# Patient Record
Sex: Female | Born: 1975 | Race: White | Hispanic: Yes | Marital: Married | Smoking: Never smoker
Health system: Southern US, Community
[De-identification: ages and names within clinical notes are randomized; demographics above are authoritative.]

## PROBLEM LIST (undated history)

## (undated) DIAGNOSIS — E039 Hypothyroidism, unspecified: Secondary | ICD-10-CM

## (undated) DIAGNOSIS — A64 Unspecified sexually transmitted disease: Secondary | ICD-10-CM

## (undated) DIAGNOSIS — D071 Carcinoma in situ of vulva: Secondary | ICD-10-CM

## (undated) DIAGNOSIS — E559 Vitamin D deficiency, unspecified: Secondary | ICD-10-CM

## (undated) DIAGNOSIS — D259 Leiomyoma of uterus, unspecified: Secondary | ICD-10-CM

## (undated) HISTORY — DX: Unspecified sexually transmitted disease: A64

## (undated) HISTORY — DX: Carcinoma in situ of vulva: D07.1

## (undated) HISTORY — DX: Hypothyroidism, unspecified: E03.9

## (undated) HISTORY — DX: Vitamin D deficiency, unspecified: E55.9

## (undated) HISTORY — PX: LAPAROSCOPIC TUBAL LIGATION: SHX1937

---

## 1999-06-16 ENCOUNTER — Emergency Department (HOSPITAL_COMMUNITY): Admission: EM | Admit: 1999-06-16 | Discharge: 1999-06-16 | Payer: Self-pay | Admitting: Emergency Medicine

## 1999-06-16 ENCOUNTER — Encounter: Payer: Self-pay | Admitting: Emergency Medicine

## 2007-07-23 ENCOUNTER — Emergency Department (HOSPITAL_COMMUNITY): Admission: EM | Admit: 2007-07-23 | Discharge: 2007-07-23 | Payer: Self-pay | Admitting: Emergency Medicine

## 2008-05-07 ENCOUNTER — Ambulatory Visit (HOSPITAL_COMMUNITY): Admission: RE | Admit: 2008-05-07 | Discharge: 2008-05-07 | Payer: Self-pay | Admitting: Family Medicine

## 2009-04-07 DIAGNOSIS — D071 Carcinoma in situ of vulva: Secondary | ICD-10-CM

## 2009-04-07 HISTORY — DX: Carcinoma in situ of vulva: D07.1

## 2009-04-21 ENCOUNTER — Ambulatory Visit: Payer: Self-pay | Admitting: Gynecology

## 2009-04-28 ENCOUNTER — Ambulatory Visit: Payer: Self-pay | Admitting: Gynecology

## 2009-05-12 ENCOUNTER — Ambulatory Visit: Payer: Self-pay | Admitting: Gynecology

## 2009-06-30 ENCOUNTER — Ambulatory Visit: Payer: Self-pay | Admitting: Gynecology

## 2009-12-29 ENCOUNTER — Ambulatory Visit: Payer: Self-pay | Admitting: Gynecology

## 2009-12-29 ENCOUNTER — Other Ambulatory Visit: Admission: RE | Admit: 2009-12-29 | Discharge: 2009-12-29 | Payer: Self-pay | Admitting: Gynecology

## 2010-05-25 ENCOUNTER — Other Ambulatory Visit: Admission: RE | Admit: 2010-05-25 | Discharge: 2010-05-25 | Payer: Self-pay | Admitting: Gynecology

## 2010-05-25 ENCOUNTER — Ambulatory Visit: Payer: Self-pay | Admitting: Gynecology

## 2011-02-21 ENCOUNTER — Other Ambulatory Visit (HOSPITAL_COMMUNITY)
Admission: RE | Admit: 2011-02-21 | Discharge: 2011-02-21 | Disposition: A | Discharge status: Home / Self Care | Payer: BC Managed Care – PPO | Source: Ambulatory Visit | Attending: Gynecology | Admitting: Gynecology

## 2011-02-21 ENCOUNTER — Other Ambulatory Visit: Payer: Self-pay | Admitting: Gynecology

## 2011-02-21 ENCOUNTER — Encounter (INDEPENDENT_AMBULATORY_CARE_PROVIDER_SITE_OTHER): Payer: BC Managed Care – PPO | Admitting: Gynecology

## 2011-02-21 DIAGNOSIS — N390 Urinary tract infection, site not specified: Secondary | ICD-10-CM

## 2011-02-21 DIAGNOSIS — R5381 Other malaise: Secondary | ICD-10-CM

## 2011-02-21 DIAGNOSIS — N760 Acute vaginitis: Secondary | ICD-10-CM

## 2011-02-21 DIAGNOSIS — Z124 Encounter for screening for malignant neoplasm of cervix: Secondary | ICD-10-CM | POA: Insufficient documentation

## 2011-02-21 DIAGNOSIS — R5383 Other fatigue: Secondary | ICD-10-CM

## 2011-02-21 DIAGNOSIS — Z13220 Encounter for screening for lipoid disorders: Secondary | ICD-10-CM

## 2011-02-21 DIAGNOSIS — R823 Hemoglobinuria: Secondary | ICD-10-CM

## 2011-02-21 DIAGNOSIS — Z833 Family history of diabetes mellitus: Secondary | ICD-10-CM

## 2011-02-21 DIAGNOSIS — A64 Unspecified sexually transmitted disease: Secondary | ICD-10-CM

## 2011-02-21 DIAGNOSIS — Z113 Encounter for screening for infections with a predominantly sexual mode of transmission: Secondary | ICD-10-CM

## 2011-02-21 DIAGNOSIS — D259 Leiomyoma of uterus, unspecified: Secondary | ICD-10-CM

## 2011-02-21 DIAGNOSIS — N831 Corpus luteum cyst of ovary, unspecified side: Secondary | ICD-10-CM

## 2011-02-21 HISTORY — DX: Unspecified sexually transmitted disease: A64

## 2011-02-22 ENCOUNTER — Other Ambulatory Visit: Payer: Self-pay | Admitting: Gynecology

## 2011-02-22 DIAGNOSIS — N644 Mastodynia: Secondary | ICD-10-CM

## 2011-03-28 ENCOUNTER — Ambulatory Visit (INDEPENDENT_AMBULATORY_CARE_PROVIDER_SITE_OTHER): Payer: BC Managed Care – PPO | Admitting: Gynecology

## 2011-03-28 DIAGNOSIS — N898 Other specified noninflammatory disorders of vagina: Secondary | ICD-10-CM

## 2011-03-28 DIAGNOSIS — Z113 Encounter for screening for infections with a predominantly sexual mode of transmission: Secondary | ICD-10-CM

## 2011-03-28 DIAGNOSIS — B373 Candidiasis of vulva and vagina: Secondary | ICD-10-CM

## 2011-03-28 DIAGNOSIS — N83209 Unspecified ovarian cyst, unspecified side: Secondary | ICD-10-CM

## 2011-03-28 DIAGNOSIS — N946 Dysmenorrhea, unspecified: Secondary | ICD-10-CM

## 2011-03-30 ENCOUNTER — Other Ambulatory Visit (INDEPENDENT_AMBULATORY_CARE_PROVIDER_SITE_OTHER): Payer: BC Managed Care – PPO | Admitting: Gynecology

## 2011-03-30 DIAGNOSIS — Z113 Encounter for screening for infections with a predominantly sexual mode of transmission: Secondary | ICD-10-CM

## 2011-04-05 ENCOUNTER — Other Ambulatory Visit: Payer: BC Managed Care – PPO | Admitting: Gynecology

## 2011-04-18 ENCOUNTER — Encounter (HOSPITAL_COMMUNITY): Payer: Self-pay

## 2011-04-18 ENCOUNTER — Institutional Professional Consult (permissible substitution) (INDEPENDENT_AMBULATORY_CARE_PROVIDER_SITE_OTHER): Payer: BC Managed Care – PPO | Admitting: Gynecology

## 2011-04-18 ENCOUNTER — Encounter (HOSPITAL_COMMUNITY)
Admission: RE | Admit: 2011-04-18 | Discharge: 2011-04-18 | Disposition: A | Discharge status: Home / Self Care | Payer: BC Managed Care – PPO | Source: Ambulatory Visit | Attending: Gynecology | Admitting: Gynecology

## 2011-04-18 DIAGNOSIS — D259 Leiomyoma of uterus, unspecified: Secondary | ICD-10-CM

## 2011-04-18 DIAGNOSIS — N949 Unspecified condition associated with female genital organs and menstrual cycle: Secondary | ICD-10-CM

## 2011-04-18 LAB — SURGICAL PCR SCREEN
MRSA, PCR: NEGATIVE
Staphylococcus aureus: NEGATIVE

## 2011-04-18 LAB — CBC
HCT: 41.9 % (ref 36.0–46.0)
MCH: 30.8 pg (ref 26.0–34.0)
MCV: 92.9 fL (ref 78.0–100.0)
Platelets: 260 10*3/uL (ref 150–400)
RDW: 13.9 % (ref 11.5–15.5)

## 2011-04-18 NOTE — Patient Instructions (Addendum)
19 Abigail Reid  04/18/2011   Your procedure is scheduled on: 04/25/11   Report to [facility] at 700 AM.  Call this number if you have problems the morning of surgery: [phone]   Remember:   Do not eat food:After Midnight.  Do not drink clear liquids: After Midnight.  Take these medicines the morning of surgery with A SIP OF WATER: N/A   Do not wear jewelry, make-up or nail polish.  Do not bring valuables to the hospital.  Contacts, dentures or bridgework may not be worn into surgery.  Leave suitcase in the car. After surgery it may be brought to your room.  For patients admitted to the hospital, checkout time is 11:00 AM the day of discharge.   Patients discharged the day of surgery will not be allowed to drive home.  Name and phone number of your driver: N/A

## 2011-04-18 NOTE — Anesthesia Preprocedure Evaluation (Signed)
Anesthesia Evaluation  Name, MR# and DOB Patient awake  General Assessment Comment  Reviewed: Allergy & Precautions and H&P   History of Anesthesia Complications Negative for: history of anesthetic complications  Airway Mallampati: III TM Distance: >3 FB Neck ROM: Full    Dental  (+) Teeth Intact and Partial Upper   Pulmonaryneg pulmonary ROS    clear to auscultation  pulmonary exam normal   Cardiovascular Regular Normal   Neuro/Psych  GI/Hepatic/Renal negative GI ROS, negative Liver ROS, and negative Renal ROS (+)       Endo/Other  Negative Endocrine ROS (+)   Abdominal Normal abdominal exam  (+)   Musculoskeletal negative musculoskeletal ROS (+)  Hematology negative hematology ROS (+)   Peds  Reproductive/Obstetrics negative OB ROS   Anesthesia Other Findings             Anesthesia Physical Anesthesia Plan  ASA: II  Anesthesia Plan: General   Post-op Pain Management:    Induction: Intravenous  Airway Management Planned: Oral ETT  Additional Equipment:   Intra-op Plan:   Post-operative Plan:   Informed Consent: I have reviewed the patients History and Physical, chart, labs and discussed the procedure including the risks, benefits and alternatives for the proposed anesthesia with the patient or authorized representative who has indicated his/her understanding and acceptance.   Dental advisory given  Plan Discussed with: Anesthesiologist (AP)  Anesthesia Plan Comments:         Anesthesia Quick Evaluation

## 2011-04-23 ENCOUNTER — Encounter (HOSPITAL_COMMUNITY): Payer: Self-pay | Admitting: Gynecology

## 2011-04-23 DIAGNOSIS — R102 Pelvic and perineal pain: Secondary | ICD-10-CM | POA: Diagnosis present

## 2011-04-23 DIAGNOSIS — D259 Leiomyoma of uterus, unspecified: Secondary | ICD-10-CM | POA: Diagnosis present

## 2011-04-23 NOTE — H&P (Signed)
Abigail Reid is an 35 y.o. female with lower abdominal pain from fibroid uterus radiating down to lower back and right leg.  Pertinent Gynecological History: Menses: flow is moderate and with severe dysmenorrhea Bleeding: normal Contraception: tubal ligation DES exposure: denies Blood transfusions: none Sexually transmitted diseases: HPV, Chlamydia Previous GYN Procedures: BTL  Last mammogram: N/A Date:N/A Last pap: normal Date:02/2011  OB History: G3, P3  Menstrual History: Menarche age: 69 No LMP recorded.    Past Medical History  Diagnosis Date  . Asthma     no inhaler needed x 42yrs  . Vaginal delivery     GRAVIDA 3 PARA 3  . 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 STD (sexually transmitted disease) 02/21/11    POS CHLAMYDIA    Past Surgical History  Procedure Date  . Tubal ligation     History reviewed. No pertinent family history.  Social History:  reports that she has never smoked. She does not have any smokeless tobacco history on file. She reports that she drinks alcohol. She reports that she does not use illicit drugs.  Allergies: No Known Allergies  No prescriptions prior to admission    Review of Systems  Constitutional: Negative.   HENT: Negative.   Eyes: Negative.   Respiratory: Negative.   Cardiovascular: Negative.   Gastrointestinal: Negative.   Genitourinary:       Patient with 11.0x6.8x6.2 cm uterus with subserous myoma 7.4x7.2x6.2 cm  Musculoskeletal: Negative.   Skin: Negative.   Neurological: Negative.   Endo/Heme/Allergies: Negative.   Psychiatric/Behavioral: Negative.     There were no vitals taken for this visit. Physical Exam  Constitutional: She is oriented to person, place, and time. She appears well-developed and well-nourished.  HENT:  Head: Normocephalic.  Eyes: Pupils are equal, round, and reactive to light.  Neck: Normal range of motion. Neck supple.  Cardiovascular: Normal  rate, regular rhythm and normal heart sounds.   Respiratory: Effort normal and breath sounds normal.  GI: Soft. Bowel sounds are normal.  Genitourinary: Rectum normal and vagina normal. Pelvic exam was performed with patient supine. Uterus is enlarged. Right adnexum displays mass.       Patient with an 11.0x6.8x6.2 cm uterus with subserous myoma 7.4 x 7.2 x 6.2 cm  Musculoskeletal: Normal range of motion.  Neurological: She is alert and oriented to person, place, and time. She has normal reflexes.  Skin: Skin is warm and dry.  Psychiatric: She has a normal mood and affect.    No results found for this or any previous visit (from the past 24 hour(s)).  No results found.  Assessment/Plan: Patient with symptomatic leio myomatous uteri scheduled for total laparoscopic hysterectomy with possible right salpingoophorectomy. Ok Edwards 04/23/2011, 2:25 PM

## 2011-04-24 ENCOUNTER — Encounter (HOSPITAL_COMMUNITY): Payer: Self-pay | Admitting: Gynecology

## 2011-04-24 NOTE — H&P (Signed)
Patient was counseled as to the risk of surgery to include the following: 1. Infection (prohylactic antibiotics will be administered) 2. DVT/Pulmonary Embolism (prophylactic pneumo compression stockings will be used) 3.Trauma to internal organs requiring additional surgical procedure to repair any injury to     Internal organs requiring perhaps additional hospitalization days. 4.Hemmorhage requiring transfusion and blood products which carry risks such as             anaphylactic reaction, hepatitis and AIDS  Patient had received literature information on the procedure scheduled and all her questions were answered in her native tongue and accepts all risk.

## 2011-04-24 NOTE — Progress Notes (Signed)
No change in patients past medical history in past 24 hours to the best of my knowledge.

## 2011-04-25 ENCOUNTER — Encounter (HOSPITAL_COMMUNITY): Payer: Self-pay | Admitting: Anesthesiology

## 2011-04-25 ENCOUNTER — Ambulatory Visit (HOSPITAL_COMMUNITY): Payer: BC Managed Care – PPO | Admitting: Certified Registered Nurse Anesthetist

## 2011-04-25 ENCOUNTER — Inpatient Hospital Stay (HOSPITAL_COMMUNITY)
Admission: RE | Admit: 2011-04-25 | Discharge: 2011-04-27 | DRG: 359 | Disposition: A | Discharge status: Home / Self Care | Payer: BC Managed Care – PPO | Source: Ambulatory Visit | Attending: Gynecology | Admitting: Gynecology

## 2011-04-25 ENCOUNTER — Other Ambulatory Visit: Payer: Self-pay | Admitting: Gynecology

## 2011-04-25 ENCOUNTER — Encounter (HOSPITAL_COMMUNITY)
Admission: RE | Disposition: A | Discharge status: Home / Self Care | Payer: Self-pay | Source: Ambulatory Visit | Attending: Gynecology

## 2011-04-25 ENCOUNTER — Encounter: Payer: Self-pay | Admitting: Gynecology

## 2011-04-25 DIAGNOSIS — R109 Unspecified abdominal pain: Secondary | ICD-10-CM | POA: Diagnosis present

## 2011-04-25 DIAGNOSIS — N949 Unspecified condition associated with female genital organs and menstrual cycle: Secondary | ICD-10-CM

## 2011-04-25 DIAGNOSIS — D259 Leiomyoma of uterus, unspecified: Secondary | ICD-10-CM | POA: Diagnosis present

## 2011-04-25 DIAGNOSIS — N946 Dysmenorrhea, unspecified: Secondary | ICD-10-CM | POA: Diagnosis present

## 2011-04-25 DIAGNOSIS — D252 Subserosal leiomyoma of uterus: Principal | ICD-10-CM | POA: Diagnosis present

## 2011-04-25 DIAGNOSIS — R102 Pelvic and perineal pain: Secondary | ICD-10-CM | POA: Diagnosis present

## 2011-04-25 HISTORY — PX: ABDOMINAL HYSTERECTOMY: SHX81

## 2011-04-25 HISTORY — DX: Leiomyoma of uterus, unspecified: D25.9

## 2011-04-25 LAB — PREGNANCY, URINE: Preg Test, Ur: NEGATIVE

## 2011-04-25 SURGERY — HYSTERECTOMY, TOTAL, LAPAROSCOPIC
Anesthesia: General | Site: Abdomen | Wound class: Clean Contaminated

## 2011-04-25 MED ORDER — GLYCOPYRROLATE 0.2 MG/ML IJ SOLN
INTRAMUSCULAR | Status: AC
  Filled 2011-04-25: qty 2

## 2011-04-25 MED ORDER — ONDANSETRON HCL 4 MG/2ML IJ SOLN
4.0000 mg | Freq: Four times a day (QID) | INTRAMUSCULAR | Status: DC | PRN
  Administered 2011-04-25: 4 mg via INTRAVENOUS
  Filled 2011-04-25: qty 2

## 2011-04-25 MED ORDER — MEPERIDINE HCL 25 MG/ML IJ SOLN
6.2500 mg | INTRAMUSCULAR | Status: DC | PRN
  Administered 2011-04-25 (×2): 12.5 mg via INTRAVENOUS

## 2011-04-25 MED ORDER — MIDAZOLAM HCL 2 MG/2ML IJ SOLN
INTRAMUSCULAR | Status: AC
  Filled 2011-04-25: qty 2

## 2011-04-25 MED ORDER — MEPERIDINE HCL 25 MG/ML IJ SOLN
INTRAMUSCULAR | Status: AC
  Administered 2011-04-25: 12.5 mg via INTRAVENOUS
  Filled 2011-04-25: qty 1

## 2011-04-25 MED ORDER — DIPHENHYDRAMINE HCL 50 MG/ML IJ SOLN: 12.5000 mg | Freq: Four times a day (QID) | INTRAMUSCULAR | Status: DC | PRN

## 2011-04-25 MED ORDER — ONDANSETRON HCL 4 MG/2ML IJ SOLN
INTRAMUSCULAR | Status: AC
  Filled 2011-04-25: qty 2

## 2011-04-25 MED ORDER — PROPOFOL 10 MG/ML IV EMUL
INTRAVENOUS | Status: AC
  Filled 2011-04-25: qty 20

## 2011-04-25 MED ORDER — FENTANYL CITRATE 0.05 MG/ML IJ SOLN
25.0000 ug | INTRAMUSCULAR | Status: DC | PRN
  Administered 2011-04-25 (×3): 50 ug via INTRAVENOUS

## 2011-04-25 MED ORDER — ONDANSETRON HCL 4 MG/2ML IJ SOLN
INTRAMUSCULAR | Status: DC | PRN
  Administered 2011-04-25 (×2): 2 mg via INTRAVENOUS

## 2011-04-25 MED ORDER — DEXTROSE 5 % IV SOLN
1.0000 g | INTRAVENOUS | Status: DC | PRN
  Administered 2011-04-25: 1 g via INTRAVENOUS

## 2011-04-25 MED ORDER — SODIUM CHLORIDE 0.9 % IJ SOLN: 9.0000 mL | INTRAMUSCULAR | Status: DC | PRN

## 2011-04-25 MED ORDER — SUFENTANIL CITRATE 50 MCG/ML IV SOLN
INTRAVENOUS | Status: AC
  Filled 2011-04-25: qty 1

## 2011-04-25 MED ORDER — LIDOCAINE HCL (CARDIAC) 20 MG/ML IV SOLN
INTRAVENOUS | Status: DC | PRN
  Administered 2011-04-25: 60 mg via INTRAVENOUS

## 2011-04-25 MED ORDER — PROPOFOL 10 MG/ML IV EMUL
INTRAVENOUS | Status: DC | PRN
  Administered 2011-04-25: 150 mg via INTRAVENOUS

## 2011-04-25 MED ORDER — LIDOCAINE HCL (CARDIAC) 20 MG/ML IV SOLN
INTRAVENOUS | Status: AC
  Filled 2011-04-25: qty 5

## 2011-04-25 MED ORDER — FENTANYL CITRATE 0.05 MG/ML IJ SOLN
INTRAMUSCULAR | Status: AC
  Administered 2011-04-25: 50 ug via INTRAVENOUS
  Filled 2011-04-25: qty 2

## 2011-04-25 MED ORDER — HYDROMORPHONE 0.3 MG/ML IV SOLN
INTRAVENOUS | Status: AC
  Administered 2011-04-25: 0.3 mg via INTRAVENOUS
  Filled 2011-04-25: qty 25

## 2011-04-25 MED ORDER — HYDROMORPHONE 0.3 MG/ML IV SOLN
INTRAVENOUS | Status: DC
  Administered 2011-04-25: 0.6 mg via INTRAVENOUS
  Administered 2011-04-25 (×2): 0.3 mg via INTRAVENOUS
  Administered 2011-04-26: 0.9 mg via INTRAVENOUS

## 2011-04-25 MED ORDER — ROCURONIUM BROMIDE 100 MG/10ML IV SOLN
INTRAVENOUS | Status: DC | PRN
  Administered 2011-04-25: 50 mg via INTRAVENOUS

## 2011-04-25 MED ORDER — KETOROLAC TROMETHAMINE 30 MG/ML IJ SOLN
INTRAMUSCULAR | Status: AC
  Filled 2011-04-25: qty 1

## 2011-04-25 MED ORDER — DEXTROSE 5 % IV SOLN
1.0000 g | INTRAVENOUS | Status: DC
  Filled 2011-04-25: qty 1

## 2011-04-25 MED ORDER — ROCURONIUM BROMIDE 50 MG/5ML IV SOLN
INTRAVENOUS | Status: AC
  Filled 2011-04-25: qty 1

## 2011-04-25 MED ORDER — LACTATED RINGERS IR SOLN
Status: DC | PRN
  Administered 2011-04-25: 3000 mL

## 2011-04-25 MED ORDER — MORPHINE SULFATE (PF) 1 MG/ML IV SOLN
INTRAVENOUS | Status: DC
  Filled 2011-04-25: qty 25

## 2011-04-25 MED ORDER — LACTATED RINGERS IV SOLN
INTRAVENOUS | Status: DC
  Administered 2011-04-25 (×4): via INTRAVENOUS

## 2011-04-25 MED ORDER — BUPIVACAINE HCL (PF) 0.25 % IJ SOLN
INTRAMUSCULAR | Status: DC | PRN
  Administered 2011-04-25: 11 mL

## 2011-04-25 MED ORDER — DEXAMETHASONE SODIUM PHOSPHATE 10 MG/ML IJ SOLN
INTRAMUSCULAR | Status: AC
  Filled 2011-04-25: qty 1

## 2011-04-25 MED ORDER — DIPHENHYDRAMINE HCL 12.5 MG/5ML PO ELIX
12.5000 mg | ORAL_SOLUTION | Freq: Four times a day (QID) | ORAL | Status: DC | PRN
  Filled 2011-04-25: qty 5

## 2011-04-25 MED ORDER — MIDAZOLAM HCL 5 MG/5ML IJ SOLN
INTRAMUSCULAR | Status: DC | PRN
  Administered 2011-04-25: .5 mg via INTRAVENOUS
  Administered 2011-04-25: 1.5 mg via INTRAVENOUS

## 2011-04-25 MED ORDER — NALOXONE HCL 0.4 MG/ML IJ SOLN: 0.4000 mg | INTRAMUSCULAR | Status: DC | PRN

## 2011-04-25 MED ORDER — NEOSTIGMINE METHYLSULFATE 1 MG/ML IJ SOLN
INTRAMUSCULAR | Status: DC | PRN
  Administered 2011-04-25: 4 mg via INTRAMUSCULAR

## 2011-04-25 MED ORDER — MORPHINE SULFATE 4 MG/ML IJ SOLN
2.0000 mg | Freq: Once | INTRAMUSCULAR | Status: AC
  Administered 2011-04-25: 2 mg via INTRAVENOUS

## 2011-04-25 MED ORDER — LACTATED RINGERS IV SOLN
INTRAVENOUS | Status: DC
  Administered 2011-04-25: 125 mL via INTRAVENOUS

## 2011-04-25 MED ORDER — NEOSTIGMINE METHYLSULFATE 1 MG/ML IJ SOLN
INTRAMUSCULAR | Status: AC
  Filled 2011-04-25: qty 10

## 2011-04-25 MED ORDER — SUFENTANIL CITRATE 50 MCG/ML IV SOLN
INTRAVENOUS | Status: DC | PRN
  Administered 2011-04-25 (×5): 10 ug via INTRAVENOUS

## 2011-04-25 MED ORDER — DEXAMETHASONE SODIUM PHOSPHATE 10 MG/ML IJ SOLN
INTRAMUSCULAR | Status: DC | PRN
  Administered 2011-04-25 (×2): 5 mg via INTRAVENOUS

## 2011-04-25 MED ORDER — KETOROLAC TROMETHAMINE 30 MG/ML IJ SOLN
INTRAMUSCULAR | Status: DC | PRN
  Administered 2011-04-25: 30 mg via INTRAVENOUS

## 2011-04-25 MED ORDER — MORPHINE SULFATE 4 MG/ML IJ SOLN
INTRAMUSCULAR | Status: AC
  Administered 2011-04-25: 2 mg via INTRAVENOUS
  Filled 2011-04-25: qty 1

## 2011-04-25 MED ORDER — HYDROMORPHONE HCL 1 MG/ML IJ SOLN
2.0000 mg | Freq: Once | INTRAMUSCULAR | Status: AC
  Administered 2011-04-25: 2 mg via INTRAVENOUS
  Filled 2011-04-25: qty 2

## 2011-04-25 MED ORDER — GLYCOPYRROLATE 0.2 MG/ML IJ SOLN
INTRAMUSCULAR | Status: DC | PRN
  Administered 2011-04-25: .6 mg via INTRAVENOUS

## 2011-04-25 SURGICAL SUPPLY — 82 items
BLADE SURG 15 STRL LF C SS BP (BLADE) ×1 IMPLANT
BLADE SURG 15 STRL SS (BLADE) ×2
CABLE HIGH FREQUENCY MONO STRZ (ELECTRODE) IMPLANT
CANISTER SUCTION 2500CC (MISCELLANEOUS) ×2 IMPLANT
CELLS DAT CNTRL 66122 CELL SVR (MISCELLANEOUS) IMPLANT
CLEANER TIP ELECTROSURG 2X2 (MISCELLANEOUS) ×1 IMPLANT
CLOTH BEACON ORANGE TIMEOUT ST (SAFETY) ×4 IMPLANT
CONT PATH 16OZ SNAP LID 3702 (MISCELLANEOUS) IMPLANT
COVER LIGHT HANDLE  1/PK (MISCELLANEOUS)
COVER LIGHT HANDLE 1/PK (MISCELLANEOUS) IMPLANT
COVER MAYO STAND STRL (DRAPES) ×2 IMPLANT
COVER TABLE BACK 60X90 (DRAPES) ×2 IMPLANT
DECANTER SPIKE VIAL GLASS SM (MISCELLANEOUS) IMPLANT
DERMABOND ADVANCED (GAUZE/BANDAGES/DRESSINGS) ×2 IMPLANT
DEVICE SUTURE ENDOST 10MM (ENDOMECHANICALS) IMPLANT
DISSECTOR BLUNT TIP ENDO 5MM (MISCELLANEOUS) IMPLANT
DRAPE CAMERA CLOSED 9X96 (DRAPES) IMPLANT
DRAPE HYSTEROSCOPY (DRAPE) ×2 IMPLANT
DRAPE UTILITY XL STRL (DRAPES) ×2 IMPLANT
DRSG TEGADERM 2.38X2.75 (GAUZE/BANDAGES/DRESSINGS) IMPLANT
ENDOSTITCH 0 SINGLE 48 (SUTURE) IMPLANT
EVACUATOR SMOKE 8.L (FILTER) ×2 IMPLANT
GLOVE BIOGEL PI IND STRL 8 (GLOVE) ×2 IMPLANT
GLOVE BIOGEL PI INDICATOR 8 (GLOVE) ×3
GLOVE ECLIPSE 7.5 STRL STRAW (GLOVE) ×8 IMPLANT
GOWN BRE IMP SLV AUR LG STRL (GOWN DISPOSABLE) ×10 IMPLANT
GYRUS RUMI II 3.5CM BLUE (DISPOSABLE) ×2
NDL HYPO 25X1 1.5 SAFETY (NEEDLE) IMPLANT
NEEDLE HYPO 25X1 1.5 SAFETY (NEEDLE) IMPLANT
NS IRRIG 1000ML POUR BTL (IV SOLUTION) ×3 IMPLANT
OCCLUDER COLPOPNEUMO (BALLOONS) ×2 IMPLANT
PACK ABDOMINAL GYN (CUSTOM PROCEDURE TRAY) ×1 IMPLANT
PACK LAPAROSCOPY BASIN (CUSTOM PROCEDURE TRAY) ×2 IMPLANT
PAD OB MATERNITY 4.3X12.25 (PERSONAL CARE ITEMS) ×2 IMPLANT
PAIN PUMP ON-Q 270MLX4ML 5IN (PAIN MANAGEMENT) IMPLANT
PENCIL BUTTON HOLSTER BLD 10FT (ELECTRODE) ×1 IMPLANT
RETRACTOR WND ALEXIS 18 MED (MISCELLANEOUS) IMPLANT
RETRACTOR WND ALEXIS 25 LRG (MISCELLANEOUS) IMPLANT
RTRCTR WOUND ALEXIS 18CM MED (MISCELLANEOUS)
RTRCTR WOUND ALEXIS 25CM LRG (MISCELLANEOUS)
RUMI II 3.0CM BLUE KOH-EFFICIE (DISPOSABLE) ×1 IMPLANT
RUMI II GYRUS 3.5CM BLUE (DISPOSABLE) IMPLANT
SCALPEL HARMONIC ACE (MISCELLANEOUS) ×1 IMPLANT
SCISSORS LAP 5X35 DISP (ENDOMECHANICALS) IMPLANT
SET CYSTO W/LG BORE CLAMP LF (SET/KITS/TRAYS/PACK) IMPLANT
SET IRRIG TUBING LAPAROSCOPIC (IRRIGATION / IRRIGATOR) ×2 IMPLANT
SPONGE LAP 18X18 X RAY DECT (DISPOSABLE) ×4 IMPLANT
STAPLER VISISTAT 35W (STAPLE) ×2 IMPLANT
STRIP CLOSURE SKIN 1/2X4 (GAUZE/BANDAGES/DRESSINGS) IMPLANT
SUT CHROMIC 3 0 SH 27 (SUTURE) IMPLANT
SUT PLAIN 3 0 PS2 27 (SUTURE) IMPLANT
SUT VIC AB 0 CT1 18XCR BRD8 (SUTURE) ×3 IMPLANT
SUT VIC AB 0 CT1 36 (SUTURE) ×4 IMPLANT
SUT VIC AB 0 CT1 8-18 (SUTURE) ×4
SUT VIC AB 1 CT1 18XBRD ANBCTR (SUTURE) IMPLANT
SUT VIC AB 1 CT1 8-18 (SUTURE)
SUT VIC AB 3-0 CT1 27 (SUTURE) ×2
SUT VIC AB 3-0 CT1 TAPERPNT 27 (SUTURE) IMPLANT
SUT VIC AB 3-0 SH 27 (SUTURE) ×2
SUT VIC AB 3-0 SH 27X BRD (SUTURE) ×1 IMPLANT
SUT VICRYL 0 TIES 12 18 (SUTURE) ×2 IMPLANT
SUT VICRYL 0 UR6 27IN ABS (SUTURE) ×4 IMPLANT
SUT VICRYL 3 0 BR 18  UND (SUTURE)
SUT VICRYL 3 0 BR 18 UND (SUTURE) IMPLANT
SUT VICRYL RAPIDE 3 0 (SUTURE) ×3 IMPLANT
SYR 50ML LL SCALE MARK (SYRINGE) ×2 IMPLANT
SYR CONTROL 10ML LL (SYRINGE) IMPLANT
TIP RUMI ORANGE 6.7MMX12CM (TIP) ×1 IMPLANT
TIP UTERINE 5.1X6CM LAV DISP (MISCELLANEOUS) IMPLANT
TIP UTERINE 6.7X10CM GRN DISP (MISCELLANEOUS) IMPLANT
TIP UTERINE 6.7X6CM WHT DISP (MISCELLANEOUS) IMPLANT
TIP UTERINE 6.7X8CM BLUE DISP (MISCELLANEOUS) IMPLANT
TOWEL OR 17X24 6PK STRL BLUE (TOWEL DISPOSABLE) ×7 IMPLANT
TRAY FOLEY CATH 14FR (SET/KITS/TRAYS/PACK) ×3 IMPLANT
TROCAR 12M 150ML BLUNT (TROCAR) ×1 IMPLANT
TROCAR BALLN 12MMX100 BLUNT (TROCAR) IMPLANT
TROCAR XCEL NON-BLD 11X100MML (ENDOMECHANICALS) ×4 IMPLANT
TROCAR XCEL NON-BLD 5MMX100MML (ENDOMECHANICALS) ×4 IMPLANT
TUBING CONNECTOR 18X5MM (MISCELLANEOUS) ×1 IMPLANT
WARMER LAPAROSCOPE (MISCELLANEOUS) ×2 IMPLANT
WATER STERILE IRR 1000ML POUR (IV SOLUTION) ×2 IMPLANT
YANKAUER SUCT BULB TIP NO VENT (SUCTIONS) ×1 IMPLANT

## 2011-04-25 NOTE — Op Note (Signed)
04/25/2011  10:39 AM  PATIENT:  Abigail Reid  35 y.o. female  PRE-OPERATIVE DIAGNOSIS:  symptomatic fibroids  POST-OPERATIVE DIAGNOSIS:  Fibroid Uterus  PROCEDURE:  Procedure(s): HYSTERECTOMY TOTAL LAPAROSCOPIC HYSTERECTOMY ABDOMINAL  SURGEON:  Surgeon(s): Ok Edwards, MD Dara Lords, MD   ANESTHESIA:   general  PROCEDURE:the patient was taken to the operating room where she underwent general endotracheal anesthesia her abdomen was prepped and draped in usual sterile fashion. A Foley catheter inserted an effort to monitor urinary output. Bimanual examination demonstrated multilobulated uterus approximately 10 week size. A speculum was inserted into the vaginal vault. Anterior cervical lip was grasped with a single-tooth tenaculum. A rheumy uterine manipulator was introduced. After the drapes were in placed a small semilunar incision was made in the umbilicus. A 10/11 mm trocar was introduced into the peritoneal cavity. A pneumoperitoneum was established for approximately 3 L of carbon dioxide. Inspection of the pelvic cavity demonstrated a multilobulated uterus approximately 11 weeks size. Based on the size of the uterus we were not able to proceed with a laparoscopic approach for the hysterectomy. Her case was then converted to an open laparotomy. The subumbilical subumbilical fascia was closed with a figure-of-eight 0 Vicryl suture. The subcutaneous tissue was reapproximated 3-0 Vicryl suture. A Pfannenstiel skin incision was then made 2 cm above the symphysis pubis. Incision was carried from the skin down to the subcutaneous tissue down to the rectus fascia. The rectus fascia was incised in a transverse fashion. The O'Connor-O'Sullivan retractors were then placed. The patient was then placed in Trendelenburg position. Heaney clamps were utilized to put traction on the proximal right left utero-ovarian ligament. The right round ligament was transected and sutured ligated with 0  Vicryl suture the posterior broad ligament was penetrated with the surgeon's finger. In the proximal utero-ovarian ligament and fallopian tube were clamped cut and suture ligated with 0 Vicryl suture and the remaining of the broad and  cardinal ligaments were clamped cut and suture ligated with 0 Vicryl suture to the level of the right vaginal fornix. Similar procedure was carried out on the contralateral side. The bladder peritoneum was peeled off the lower uterine surface. Heaney clamps were placed on the right and left angles incorporating the right and left vaginal fornix respectively. Jergensen scissors were used to excise the  and passed off the operative field with the uterus leaving both right and left ovaries behind. Both angle sutures were secured with a transfixation stitch of 0 Vicryl suture and the remaining vaginal cuff was closed with interrupted sutures of 0 Vicryl suture. The pelvic cavity was then copiously area irrigated with normal saline solution. All pedicles were inspected and adequate hemostasis was evident. Sponge count needle count were correct the retractor was removed. The visceral peritoneum was not reapproximated. The rectus fascia was then closed with a running stitch of 0 Vicryl suture. The subcutaneous bleeders were Bovie cauterized. The skin was reapproximated with skin clips while by placement of Xeroform gauze and 4 x 4 dressing. Of note a quarter percent Marcaine was infiltrated subcutaneously in the incision sites for postoperative analgesia for a total of 50 cc.  FINDINGS:a multilobulated fibroid uterus was identified approximately 6-7 cm anterior lower uterine segment and a 3 x 5 cm fibroid coming off the left fundal region of the uterus.   ESTIMATED BLOOD LOSS:*100 cc    Intake/Output Summary (Last 24 hours) at 04/25/11 1039 Last data filed at 04/25/11 1015  Gross per 24 hour  Intake  2050 ml  Output    400 ml  Net   1650 ml     BLOOD ADMINISTERED:none    LOCAL MEDICATIONS USED:  MARCAINE 12CC  SPECIMEN:  Source of Specimen:  uterus and cervix  DISPOSITION OF SPECIMEN:  PATHOLOGY  COUNTS:  YES  PLAN OF CARE: Transfer to PACU

## 2011-04-25 NOTE — Anesthesia Postprocedure Evaluation (Signed)
  Anesthesia Post-op Note  Patient: Abigail Reid  Procedure(s) Performed:  HYSTERECTOMY TOTAL LAPAROSCOPIC - attempted; HYSTERECTOMY ABDOMINAL - Incision at 0913  Patient is awake and responsive. Pain and nausea are reasonably well controlled. Vital signs are stable and clinically acceptable. Oxygen saturation is clinically acceptable. There are no apparent anesthetic complications at this time. Patient is ready for discharge.

## 2011-04-25 NOTE — Transfer of Care (Signed)
Immediate Anesthesia Transfer of Care Note  Patient: Abigail Reid  Procedure(s) Performed:  HYSTERECTOMY TOTAL LAPAROSCOPIC - attempted; HYSTERECTOMY ABDOMINAL - Incision at 0913  Patient Location: PACU  Anesthesia Type: General  Level of Consciousness: awake, alert  and oriented  Airway & Oxygen Therapy: Patient Spontanous Breathing and Patient connected to nasal cannula oxygen  Post-op Assessment: Report given to PACU RN, Post -op Vital signs reviewed and stable and Patient moving all extremities X 4  Post vital signs: stable  Complications: No apparent anesthesia complications

## 2011-04-25 NOTE — Plan of Care (Signed)
Problem: Phase I Progression Outcomes Goal: Other Phase I Outcomes/Goals Pt had pain controll issues on admit to floor, Dr. Lily Peer called.  PCA Morphine D/C'ed, changed to Dilaudid. c good results.

## 2011-04-25 NOTE — Progress Notes (Signed)
UR chart review completed.  

## 2011-04-26 LAB — CBC
HCT: 30.9 % — ABNORMAL LOW (ref 36.0–46.0)
Hemoglobin: 10.2 g/dL — ABNORMAL LOW (ref 12.0–15.0)
MCH: 30.6 pg (ref 26.0–34.0)
MCHC: 33 g/dL (ref 30.0–36.0)
MCV: 92.8 fL (ref 78.0–100.0)

## 2011-04-26 MED ORDER — OXYCODONE-ACETAMINOPHEN 5-325 MG PO TABS
1.0000 | ORAL_TABLET | ORAL | Status: DC | PRN
  Administered 2011-04-26 – 2011-04-27 (×4): 2 via ORAL
  Filled 2011-04-26 (×4): qty 2

## 2011-04-26 MED ORDER — SODIUM CHLORIDE 0.9 % IJ SOLN
3.0000 mL | Freq: Two times a day (BID) | INTRAMUSCULAR | Status: DC
  Administered 2011-04-26: 3 mL via INTRAVENOUS

## 2011-04-26 NOTE — Progress Notes (Signed)
Subjective: Patient reports Better pain relief on Dilaudid PCA   Objective: I have reviewed patient's intake and output and labs.  General: alert and cooperative Resp: clear to auscultation bilaterally Cardio: regular rate and rhythm, S1, S2 normal, no murmur, click, rub or gallop GI: soft hypoactive bowel sounds Extremities: extremities normal, atraumatic, no cyanosis or edema incision intact Filed Vitals:   04/26/11 1001  BP: 109/67  Pulse: 69  Temp: 99.5 F (37.5 C)  Resp: 16   .cbc 10.2/32.9  Assessment/Plan:patient last night had some difficulty with a PCA pump morphine. It was later changed to Dilaudid and she had a comfortable night. Her incision site was intact and she had hypoactive bowel sounds. The plan will be to DC her PCA pump today as well as her Foley catheter. She will be started on a clear liquid diet instructor ambulate. And she will be started on Percocet one to 2 tablets by mouth every 4-6 hours when necessary. Will Hep-Lock her IV once she is tolerating clear liquids and advance as tolerated.   LOS: 1 day    Natoya Viscomi H 04/26/2011, 10:52 AM

## 2011-04-27 MED ORDER — OXYCODONE-ACETAMINOPHEN 5-325 MG PO TABS: 1.0000 | ORAL_TABLET | ORAL | Status: AC | PRN

## 2011-04-27 MED ORDER — METOCLOPRAMIDE HCL 10 MG PO TABS: 10.0000 mg | ORAL_TABLET | Freq: Three times a day (TID) | ORAL | Status: DC

## 2011-04-27 NOTE — Progress Notes (Signed)
  Physician Discharge Summary  Patient ID: Abigail Reid MRN: [id number] DOB/AGE: 1976-03-09 35 y.o.  Admit date: 04/25/2011 Discharge date: 04/27/2011  Admission Diagnoses: symptomatic fibroids   Discharge Diagnoses:  Principal Problem:  *Pelvic pain in female Active Problems:  Fibroid uterus   Hospital Course:patient underwent a total abdominal hysterectomy. On her postoperative day her urine output was adequate. She was started on clear liquid diet and her Foley catheter was discontinued. She was switched over from the PCA pump to oral Percocet. By her second postoperative day she was up ambulating and tolerating regular diet and was ready for discharge home. Her vital signs were stable she was afebrile and her incision site was intact.  Filed Vitals:   04/27/11 0500  BP: 98/62  Pulse: 100  Temp: 98.6 F (37 C)  Resp: 18        Discharge Exam:  Consults: none  Significant Diagnostic Studies: non3  Treatments: surgery: TAH  Disposition: Final discharge disposition not confirmed  Discharge Orders    Future Appointments: Provider: Department: Dept Phone: Center:   05/01/2011 10:10 AM Ok Edwards, MD [facility] [phone] [facility]     Future Orders Please Complete By Expires   Diet general      Discharge instructions      Comments:   Flllow up with Dr. Lily Peer Tuesday 10:10am to have staples removed   Increase activity slowly      Driving Restrictions      Comments:   1 week   Lifting restrictions      Comments:   6 weeks   Sexual Activity Restrictions      Comments:   6 weeks   Call MD for:  temperature >100.4      Call MD for:  persistant nausea and vomiting      Call MD for:  severe uncontrolled pain      Call MD for:  redness, tenderness, or signs of infection (pain, swelling, redness, odor or green/yellow discharge around incision site)      Call MD for:  difficulty breathing, headache or visual disturbances      Call MD for:  hives       Call MD for:  persistant dizziness or light-headedness          Current Discharge Medication List    START taking these medications   Details  metoCLOPramide (REGLAN) 10 MG tablet Take 1 tablet (10 mg total) by mouth 3 (three) times daily with meals. Qty: 30 tablet, Refills: 0    oxyCODONE-acetaminophen (PERCOCET) 5-325 MG per tablet Take 1-2 tablets by mouth every 4 (four) hours as needed. Qty: 30 tablet, Refills: 0      CONTINUE these medications which have NOT CHANGED   Details  ibuprofen (ADVIL,MOTRIN) 800 MG tablet Take 800 mg by mouth every 8 (eight) hours as needed.           Follow-up Information    Follow up with Ok Edwards, MD on 05/01/2011. (10:10 am)    Contact information:   [address], [address] [place] [address] [phone]           Signed: Ok Edwards 04/27/2011, 8:29 AM

## 2011-04-27 NOTE — Progress Notes (Signed)
2 Days Post-Op Procedure(s): HYSTERECTOMY TOTAL LAPAROSCOPIC HYSTERECTOMY ABDOMINAL  Subjective: Patient reports + flatus and no problems voiding.    Objective: I have reviewed patient's vital signs.  General: alert Resp: clear to auscultation bilaterally Cardio: regular rate and rhythm, S1, S2 normal, no murmur, click, rub or gallop GI: soft, non-tender; bowel sounds normal; no masses,  no organomegaly Extremities: extremities normal, atraumatic, no cyanosis or edema incision site intact  Assessment: s/p Procedure(s): HYSTERECTOMY TOTAL LAPAROSCOPIC HYSTERECTOMY ABDOMINAL: stable, progressing well and tolerating diet  Plan: Advance diet Discontinue IV fluids Discharge home  LOS: 2 days    FERNANDEZ,JUAN H 04/27/2011, 8:38 AM

## 2011-05-01 ENCOUNTER — Ambulatory Visit (INDEPENDENT_AMBULATORY_CARE_PROVIDER_SITE_OTHER): Payer: BC Managed Care – PPO | Admitting: Gynecology

## 2011-05-01 ENCOUNTER — Encounter: Payer: Self-pay | Admitting: Gynecology

## 2011-05-01 VITALS — BP 124/80

## 2011-05-01 DIAGNOSIS — R3915 Urgency of urination: Secondary | ICD-10-CM

## 2011-05-01 DIAGNOSIS — R823 Hemoglobinuria: Secondary | ICD-10-CM

## 2011-05-01 NOTE — Progress Notes (Signed)
Addended byCammie Mcgee T on: 05/01/2011 12:03 PM   Modules accepted: Orders

## 2011-05-01 NOTE — Patient Instructions (Signed)
Tomar motrin 800 mg una tres veces al dia para el dolor Tomar Cipro (antibiotico) una tres veces al dia por cinco dias Magnesium Citrate botella tomar Venida Jarvis y Monia Sabal Miralax una cuchara con el jugo diaria Colace una tableta todas las noches

## 2011-05-01 NOTE — Progress Notes (Signed)
Patient presented to office today to remove staples. Incision intact. Staples removed and sterri-stripped. Patient c/o constipation. Difficulty voiding this am, in and out cath sent for u/a and culture. U/A 1 plus bacteria. Will cut down on narcotic and prescribe motrin, miralx and colace. Tonight take bottle of mag citrate.Will start cipro 250 mg bid for five days and f/u in two weeks.

## 2011-05-15 ENCOUNTER — Encounter: Payer: Self-pay | Admitting: Gynecology

## 2011-05-15 ENCOUNTER — Ambulatory Visit (INDEPENDENT_AMBULATORY_CARE_PROVIDER_SITE_OTHER): Payer: BC Managed Care – PPO | Admitting: Gynecology

## 2011-05-15 VITALS — BP 110/70

## 2011-05-15 DIAGNOSIS — Z9889 Other specified postprocedural states: Secondary | ICD-10-CM

## 2011-05-15 NOTE — Progress Notes (Signed)
Patient presented to the office today for a three-week postoperative visit she was in attempted total laparoscopic hysterectomy but had to be converted to a total abdominal hysterectomy with ovarian conservation as a result of symptomatic leiomyomatous uteri contributing to dysmenorrhea menorrhagia and pelvic and back pains. Patient is doing well is asymptomatic and is now having normal bowel movements she had issues with constipation but now she is off narcotics her bowel movements her back to normal.  Exam: Abdomen: Incision intact Steri-Strips removed. Soft nontender no rebound guarding Pelvic: Bartholin urethra Skene was within normal limits Vagina: Cuff intact no gross lesions on inspection, bimanual exam unremarkable  Assessment: 3 weeks postop total abdominal hysterectomy doing well will return to the office in 3 weeks before return back to work. Instructions provided in Spanish and we'll follow accordingly.

## 2011-05-16 ENCOUNTER — Encounter (HOSPITAL_COMMUNITY): Payer: Self-pay | Admitting: Gynecology

## 2011-06-05 ENCOUNTER — Encounter: Payer: Self-pay | Admitting: Gynecology

## 2011-06-05 ENCOUNTER — Ambulatory Visit (INDEPENDENT_AMBULATORY_CARE_PROVIDER_SITE_OTHER): Payer: BC Managed Care – PPO | Admitting: Gynecology

## 2011-06-05 VITALS — BP 128/80

## 2011-06-05 DIAGNOSIS — Z9889 Other specified postprocedural states: Secondary | ICD-10-CM

## 2011-06-05 NOTE — Progress Notes (Signed)
Patient is a 35 year old gravida 3 para 3 who presented to the office today for her final postop visit she is status post attempted total laparoscopic hysterectomy which was converted to a total abdominal hysterectomy on 04/25/2011. Her hysterectomy had been undertaken as a result of a symptomatic leiomyomatous uteri contributing to right lower quadrant pain radiating down her leg and back discomfort. Patient is doing well her pathology report had demonstrated benign cervix with mild chronic cervicitis and squamous metaplasia along with a benign secretory phase endometrium and a leiomyomatous uteri.  Exam: Abdomen soft nontender no rebound or guarding Pfannenstiel incision intact Pelvic 4/or thin urethra Skene glands: Unremarkable Vagina: No lesion discharge vaginal cuff intact Bimanual exam: No palpable masses or tenderness Rectal exam: Deferred  Assessment 6 weeks postop status post total abdominal hysterectomy patient doing well and note was provided for her to return back to work she may resume full normal activities and otherwise will see her back in one year or when necessary.

## 2012-01-16 ENCOUNTER — Encounter: Payer: Self-pay | Admitting: Gynecology

## 2012-01-16 ENCOUNTER — Ambulatory Visit (INDEPENDENT_AMBULATORY_CARE_PROVIDER_SITE_OTHER): Payer: BC Managed Care – PPO | Admitting: Gynecology

## 2012-01-16 VITALS — BP 122/80

## 2012-01-16 DIAGNOSIS — N898 Other specified noninflammatory disorders of vagina: Secondary | ICD-10-CM

## 2012-01-16 DIAGNOSIS — N940 Mittelschmerz: Secondary | ICD-10-CM

## 2012-01-16 DIAGNOSIS — A499 Bacterial infection, unspecified: Secondary | ICD-10-CM

## 2012-01-16 DIAGNOSIS — B9689 Other specified bacterial agents as the cause of diseases classified elsewhere: Secondary | ICD-10-CM

## 2012-01-16 DIAGNOSIS — N760 Acute vaginitis: Secondary | ICD-10-CM

## 2012-01-16 LAB — WET PREP FOR TRICH, YEAST, CLUE
Trich, Wet Prep: NONE SEEN
Yeast Wet Prep HPF POC: NONE SEEN

## 2012-01-16 MED ORDER — CLINDAMYCIN PHOSPHATE 2 % VA CREA: 1.0000 | TOPICAL_CREAM | Freq: Every day | VAGINAL | Status: AC

## 2012-01-16 NOTE — Progress Notes (Signed)
Patient presented to the office today complaining of slight discharge with some odor. Patient status post total abdominal hysterectomy in 2012 and is doing well otherwise. She is in a monogamous relationship. She also was complaining of some slight right lower quadrant discomfort that comes on and off once a month. She denies any dyspareunia. No GI or GU complaints.  Exam: Abdomen: Soft nontender no rebound or guarding Pelvic: Bartholin urethra Skene was within normal limits Vagina: Slight discharge with odor clear. Vaginal cuff intact Bimanual: No palpable masses or tenderness Rectal exam: Not done  Wet prep positive amine with clue cells present in 2 layers to count bacteria    Assessment/plan: Bacterial vaginosis will treat with Cleocin vaginal cream to apply each bedtime for 5 nights. Patient was reassured that the discomfort that she is experiencing his mittelschmerz attributed to ovulation. Her exam was otherwise unremarkable. She can take over-the-counter ibuprofen on a when necessary basis. She scheduled to return back next month her annual gynecological examination. Instructions were provided in Spanish and her questions are answered.

## 2012-01-16 NOTE — Patient Instructions (Signed)
Vaginosis bacteriana (Bacterial Vaginosis) La vaginosis bacteriana es una infeccin vaginal en la que el equilibrio normal de las bacterias de la vagina se modifica. Este equilibrio normal se ve afectado por un desarrollo excesivo de ciertas bacterias. Hay diferentes tipos de bacteria que causan la vaginosis bacteriana. Es el problema vaginal ms comn en las mujeres de edad frtil. CAUSAS  La causa de este trastorno no se conoce bien. Se produce como consecuencia de un aumento o desequilibrio de las bacterias nocivas.   Algunas actividades o conductas pueden poner en peligro el equilibrio normal de las bacterias en la vagina, y aumentar el riesgo. Entre ellas:   Tener un compaero sexual o mltiples compaeros sexuales.   Las duchas vaginales   Usar un dispositivo intrauterino (DIU) como mtodo anticonceptivo.   No se conoce el papel que juega la actividad sexual en el desarrollo de una VB. Sin embargo, las mujeres que nunca tuvieron relaciones sexuales raramente se infectan.  El contagio no se produce en asientos de baos, camas, piscinas o por tocar objetos.  SNTOMAS  Flujo vaginal grisceo.   Olor parecido al pescado con la secrecin, en especial despus de tener relaciones sexuales.   Picazn o irritacin de la vagina y la vulva.   Ardor o dolor al orinar.   Algunas mujeres no presentan ningn sntoma.  DIAGNSTICO El mdico realizar un examen vaginal para diagnosticar una vaginosis bacteriana. El mdico le indicar anlisis de laboratorio y observar las muestras del lquido vaginal en el microscopio. Buscar bacterias y clulas anormales (clulas clave), pH mayor a 4.5 y una prueba de aminas positivo, todos ellos asociados al BV.  RIESGOS Y COMPLICACIONES  Enfermedad plvica inflamatoria (EPI).   Infecciones luego de una ciruga ginecolgica.   VIH.   Virus del Herpes  TRATAMIENTO En algunos casos, la infeccin desaparece sin tratamiento. Sin embargo, todas las  mujeres con sntomas de VB deben tratarse para evitar complicaciones, especialmente si se ha planificado una ciruga ginecolgica. Los compaeros varones generalmente no necesitan tratamiento. Sin embargo, puede contagiarse entre parejas femeninas, de modo que el tratamiento se realiza para evitar recurrencias.   La VB puede tratarse con medicamentos que destruyen grmenes (antibiticos). Estos se presentan en pldoras o en cremas vaginales. Tanto mujeres embarazadas como no embarazadas pueden usar ambos, pero se indican en dosis diferentes. Estos antibiticos no daan al beb.   La VB puede recurrir luego del tratamiento. Si esto ocurre, se prescribir un segundo tratamiento con antibiticos.   El tratamiento es importante en el caso de las mujeres embarazadas. Si no se trata, la VB puede causar parto prematuro, especialmente en una mujer que ha tenido un parto prematuro en el pasado. Todas las mujeres embarazadas que tienen sntomas de VB deben ser controladas y tratadas.   En los casos de recurrencia crnica, se prescribe un tratamiento con un gel vaginal dos veces por semana  INSTRUCCIONES PARA EL CUIDADO DOMICILIARIO  Tome los medicamentos que le indic el mdico.   No mantenga relaciones sexuales hasta completar el tratamiento.   Comunique a sus compaeros sexuales que sufre una infeccin vaginal. Ellos deben concurrir para un control mdico si tienen problemas como una urticaria leve o picazn.   Practique el sexo seguro. Use preservativos. Tenga un solo compaero sexual.  PREVENCIN Algunos pasos bsicos de prevencin pueden ayudar a reducir el riesgo de desequilibrio de las bacterias vaginales y de sufrir VB.  No mantener relaciones sexuales (abstinencia)   No utilice duchas vaginales.   Utilice todos los medicamentos   que le han prescripto para el tratamiento, aunque los sntomas hayan desaparecido.   Comunique a su compaero sexual que sufre una VB. De ese modo podr tratase, si  es necesario, y podr evitar una recurrencia.  SOLICITE ATENCIN MDICA SI:  Los sntomas no mejoran luego de 3 das de tratamiento.   Aumentan la secrecin, el dolor o la fiebre.  ASEGRESE QUE:   Comprende estas instrucciones.   Controlar su enfermedad.   Solicitar ayuda de inmediato si no mejora o empeora.  PARA MS INFORMACIN: Division de STD Prevention (DSTDP), Centers for Disease Control and Prevention (Centros para el control y la prevencin de enfermedades, CDC): www.cdc.gov/std [employer] Association (ASHA): www.ashastd.org  Document Released: 01/01/2008 Document Revised: 09/13/2011 [employer] Patient Information 2012 [employer]. 

## 2012-02-13 ENCOUNTER — Encounter: Payer: BC Managed Care – PPO | Admitting: Gynecology

## 2012-06-05 ENCOUNTER — Ambulatory Visit (INDEPENDENT_AMBULATORY_CARE_PROVIDER_SITE_OTHER): Payer: BC Managed Care – PPO | Admitting: Gynecology

## 2012-06-05 ENCOUNTER — Other Ambulatory Visit (HOSPITAL_COMMUNITY)
Admission: RE | Admit: 2012-06-05 | Discharge: 2012-06-05 | Disposition: A | Discharge status: Home / Self Care | Payer: BC Managed Care – PPO | Source: Ambulatory Visit | Attending: Gynecology | Admitting: Gynecology

## 2012-06-05 ENCOUNTER — Encounter: Payer: Self-pay | Admitting: Gynecology

## 2012-06-05 VITALS — BP 124/78 | Ht 61.5 in | Wt 179.0 lb

## 2012-06-05 DIAGNOSIS — R21 Rash and other nonspecific skin eruption: Secondary | ICD-10-CM

## 2012-06-05 DIAGNOSIS — R635 Abnormal weight gain: Secondary | ICD-10-CM

## 2012-06-05 DIAGNOSIS — D071 Carcinoma in situ of vulva: Secondary | ICD-10-CM | POA: Insufficient documentation

## 2012-06-05 DIAGNOSIS — Z01419 Encounter for gynecological examination (general) (routine) without abnormal findings: Secondary | ICD-10-CM

## 2012-06-05 DIAGNOSIS — Z8619 Personal history of other infectious and parasitic diseases: Secondary | ICD-10-CM | POA: Insufficient documentation

## 2012-06-05 LAB — CBC WITH DIFFERENTIAL/PLATELET
Basophils Relative: 0 % (ref 0–1)
Eosinophils Absolute: 0.3 10*3/uL (ref 0.0–0.7)
HCT: 38.5 % (ref 36.0–46.0)
Hemoglobin: 13.2 g/dL (ref 12.0–15.0)
Lymphs Abs: 2.9 10*3/uL (ref 0.7–4.0)
MCH: 30.6 pg (ref 26.0–34.0)
MCHC: 34.3 g/dL (ref 30.0–36.0)
Monocytes Absolute: 0.6 10*3/uL (ref 0.1–1.0)
Monocytes Relative: 6 % (ref 3–12)
Neutrophils Relative %: 62 % (ref 43–77)
RBC: 4.31 MIL/uL (ref 3.87–5.11)

## 2012-06-05 LAB — TSH: TSH: 16.44 u[IU]/mL — ABNORMAL HIGH (ref 0.350–4.500)

## 2012-06-05 LAB — CHOLESTEROL, TOTAL: Cholesterol: 194 mg/dL (ref 0–200)

## 2012-06-05 MED ORDER — PREDNISONE (PAK) 10 MG PO TABS: 10.0000 mg | ORAL_TABLET | Freq: Every day | ORAL | Status: DC

## 2012-06-05 NOTE — Progress Notes (Signed)
Abigail Reid 26-May-1976 [id number]   History:    36 y.o.  for annual gyn exam who presented with a complaint of the past several weeks of having  generalized pruritus. She did state that she started drinking a herbal product which she purchased over-the-counter  to help her lose weight. She denies any photosensitivity or joint pains.  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 Patient 2012 had a full STD screening which was negative. She has had history in the past of Chlamydia infection.  Past medical history,surgical history, family history and social history were all reviewed and documented in the EPIC chart.  Gynecologic History Patient's last menstrual period was 03/28/2011. Contraception: Hysterectomy Last Pap: 2012. Results were: normal Last mammogram: No prior study. Results were: No prior study  Obstetric History OB History    Grav Para Term Preterm Abortions TAB SAB Ect Mult Living   3 3 3       3      # Outc Date GA Lbr Len/2nd Wgt Sex Del Anes PTL Lv   1 TRM     F SVD  No Yes   2 TRM     F SVD  No Yes   3 TRM     F SVD  No Yes       ROS: A ROS was performed and pertinent positives and negatives are included in the history.  GENERAL: No fevers or chills. HEENT: No change in vision, no earache, sore throat or sinus congestion. NECK: No pain or stiffness. CARDIOVASCULAR: No chest pain or pressure. No palpitations. PULMONARY: No shortness of breath, cough or wheeze. GASTROINTESTINAL: No abdominal pain, nausea, vomiting or diarrhea, melena or bright red blood per rectum. GENITOURINARY: No urinary frequency, urgency, hesitancy or dysuria.  MUSCULOSKELETAL: No joint or muscle pain, no back pain, no recent trauma. DERMATOLOGIC: Complaining of generalized pruritus especially in her back ENDOCRINE: No polyuria, polydipsia, no heat or cold intolerance. No recent change in weight. HEMATOLOGICAL: No anemia or easy bruising or bleeding. NEUROLOGIC: No headache, seizures, numbness, tingling or weakness. PSYCHIATRIC: No depression, no loss of interest in normal activity or change in sleep pattern.     Exam: chaperone present  BP 124/78  Ht 5' 1.5" (1.562 m)  Wt 179 lb (81.194 kg)  BMI 33.27 kg/m2  LMP 03/28/2011  Body mass index is 33.27 kg/(m^2).  General appearance : Well developed well nourished female. No acute distress HEENT: Neck supple, trachea midline, no carotid bruits, no thyroidmegaly Lungs: Clear to auscultation, no rhonchi or wheezes, or rib retractions  Heart: Regular rate and rhythm, no murmurs or gallops Breast:Examined in sitting and supine position were symmetrical in appearance, no palpable masses or tenderness,  no skin retraction, no nipple inversion, no nipple discharge, no skin discoloration, no axillary or supraclavicular lymphadenopathy Abdomen: no palpable masses or tenderness, no rebound or guarding Extremities: no edema or skin discoloration or tenderness Skin: Patient with evidence of scratch marks on her back no whelps seen.   Pelvic:  Bartholin, Urethra, Skene Glands: Within normal limits             Vagina: No gross lesions or discharge  Cervix: Absent Uterus absent  Adnexa  Without masses or tenderness  Anus and perineum  normal   Rectovaginal  normal sphincter tone without palpated masses or tenderness             Hemoccult not done     Assessment/Plan:  36 y.o. female for annual exam with hypersensitivity reaction which may be due to to the over-the-counter product that she bought for weight reduction. I've asked her to discontinue taking this product and monitor her symptoms. She will be  placed on prednisone (Sterapred uni pack) 10 mg tablet to take over 6 days. She will buy over-the-counter Benadryl lotion to apply as well as tablet to take when necessary for pruritus. If her symptoms have not resolved after discontinuing the over-the-counter product that she was taking  I have recommended that she followup with an allergist for skin testing. The following labs will be drawn today: Hemoglobin A1c, total cholesterol, TSH, CBC, urinalysis and Pap smear. She was reminded importance of monthly self breast examination. Literature information on diet and exercise was provided in [place].  Ok Edwards MD, 3:18 PM 06/05/2012

## 2012-06-05 NOTE — Addendum Note (Signed)
Addended by: Bertram Savin A on: 06/05/2012 04:10 PM   Modules accepted: Orders

## 2012-06-05 NOTE — Patient Instructions (Addendum)
Ejercicios para perder peso (Exercise to Lose Weight) La actividad fsica y Neomia Dear dieta saludable ayudan a perder peso. El mdico podr sugerirle ejercicios especficos. IDEAS Y CONSEJOS PARA HACER EJERCICIOS  Elija opciones econmicas que disfrute hacer , como caminar, andar en bicicleta o los vdeos para ejercitarse.   Utilice las Microbiologist del ascensor.   Camine durante la hora del almuerzo.   Estacione el auto lejos del lugar de [place] o [place].   Concurra a un gimnasio o tome clases de gimnasia.   Comience con 5  10 minutos de actividad fsica por da. Ejercite hasta 30 minutos, 4 a 6 das por [address]   Utilice zapatos que tengan un buen soporte y ropas cmodas.   Elongue antes y despus de Company secretary.   Ejercite hasta que aumente la respiracin y el corazn palpite rpido.   Beba agua extra cuando ejercite.   No haga ejercicio Firefighter, sentirse mareado o que le falte mucho el aire.  La actividad fsica puede quemar alrededor de 150 caloras.  Correr 20 cuadras en 15 minutos.   Jugar vley durante 45 a 60 minutos.   Limpiar y encerar el auto durante 45 a 60 minutos.   Jugar ftbol americano de toque.   Caminar 25 cuadras en 35 minutos.   Empujar un cochecito 20 cuadras en 30 minutos.   Jugar baloncesto durante 30 minutos.   Rastrillar hojas secas durante 30 minutos.   Andar en bicicleta 80 cuadras en 30 minutos.   Caminar 30 cuadras en 30 minutos.   Bailar durante 30 minutos.   Quitar la nieve con una pala durante 15 minutos.   Nadar vigorosamente durante 20 minutos.   Subir escaleras durante 15 minutos.   Andar en bicicleta 60 cuadras durante 15 minutos.   Arreglar el jardn entre 30 y 45 minutos.   Saltar a la soga durante 15 minutos.   Limpiar vidrios o pisos durante 45 a 60 minutos.  Document Released: 12/29/2010 Document Revised: 06/06/2011 [facility] Patient Information 2012 [place], [place].                                                    Control del colesterol  Los niveles de colesterol en el organismo estn determinados significativamente por su dieta. Los niveles de colesterol tambin se relacionan con la enfermedad cardaca. El material que sigue ayuda a Software engineer relacin y a Chiropractor qu puede hacer para mantener su corazn sano. No todo el colesterol es [place]. Las lipoprotenas de baja densidad (LDL) forman el colesterol "malo". El colesterol malo puede ocasionar depsitos de grasa que se acumulan en el interior de las arterias. Las lipoprotenas de alta densidad (HDL) es el colesterol "bueno". Ayuda a remover el colesterol LDL "malo" de la [place]. El colesterol es un factor de riesgo muy importante para la enfermedad cardaca. Otros factores de riesgo son la hipertensin arterial, el hbito de fumar, el estrs, la herencia y [place].  El msculo cardaco obtiene el suministro de sangre a travs de las arterias coronarias. Si su colesterol LDL ("malo") est elevado y el HDL ("bueno") es bajo, tiene un factor de riesgo para que se formen depsitos de Holiday representative en las arterias coronarias (los vasos sanguneos que suministran sangre al corazn). Esto hace que haya menos lugar para que la sangre circule. Sin la suficiente sangre y  oxgeno, el msculo cardaco no puede funcionar correctamente, y usted podr sentir dolores en el pecho (angina pectoris). Cuando una arteria coronaria se cierra completamente, una parte del msculo cardaco puede morir (infarto de miocardio). CONTROL DEL COLESTEROL Cuando el profesional que lo asiste enva la sangre al laboratorio para Artist nivel de colesterol, puede realizarle tambin un perfil completo de los lpidos. Con esta prueba, se puede determinar la cantidad total de colesterol, as como los niveles de LDL y HDL. Los triglicridos son un tipo de grasa que circula en la sangre y que tambin puede utilizarse para determinar el riesgo de enfermedad cardaca. En la siguiente tabla se  establecen los nmeros ideales: Prueba: Colesterol total  Menos de 200 mg/dl.  Prueba: LDL "colesterol malo"  Menos de 100 mg/dl.   Menos de 70 mg/dl si tiene riesgo muy elevado de sufrir un ataque cardaco o muerte cardaca sbita.  Prueba: HDL "colesterol bueno"  Mujeres: Ms de 50 mg/dl.   Hombres: Ms de 40 mg/dl.  Prueba: Trigliceridos  Menos de 150 mg/dl.  CONTROL DEL COLESTEROL CON DIETA Aunque factores como el ejercicio y el estilo de vida son importantes, la "primera lnea de ataque" es la dieta. Esto se debe a que se sabe que ciertos alimentos hacen subir el colesterol y otros lo [place]. El objetivo debe ser [employer] alimentos, de modo que tengan un efecto sobre el colesterol y, an ms importante, Microbiologist las grasas saturadas y trans con otros tipos de grasas, como las monoinsaturadas y las poliinsaturadas y cidos grasos omega-3 . En promedio, una persona no debe consumir ms de 15 a 17 g de grasas saturadas por [employer]. Las grasas saturadas y trans se consideran grasas "malas", ya que elevan el colesterol LDL. Las grasas saturadas se encuentran principalmente en productos animales como carne, [place] y crema. Pero esto no significa que usted Marketing executive todas sus comidas favoritas. Actualmente, como lo muestra el cuadro que figura al final de este documento, hay sustitutos de buen sabor, bajos en grasas y en colesterol, para la mayora de los alimentos que a usted Musician. Elija aquellos alimentos alternativos que sean bajos en grasas o sin grasas. Elija cortes de carne del cuarto trasero o lomo ya que estos cortes son los que tienen menor cantidad de grasa y Oncologist. El pollo (sin piel), el pescado, la carne de ternera, y la [place] de [place] molida son excelentes opciones. Elimine las carnes [employer] o el salami. Los [employer] o nada de grasas saturadas. Cuando consuma carne [place], carne de aves de corral, o pescado, hgalo en porciones de  85 gramos (3 onzas). Las grasas trans tambin se llaman "aceites parcialmente hidrogenados". Son aceites manipulados cientficamente de [place] que son slidos a Publishing rights manager, tienen una larga vida y Glass blower/designer sabor y la textura de los alimentos a los que se Scientist, clinical (histocompatibility and immunogenetics). Las grasas trans se encuentran en la [place], [place], crackers y alimentos horneados.  Para hornear y cocinar, el aceite es un excelente sustituto para la [place]. Los aceites monoinsaturados tienen un beneficio particular, ya que se cree que disminuyen el colesterol LDL (colesterol malo) y elevan el HDL. Deber evitar los aceites tropicales saturados como el de coco y el de [place].  Recuerde, adems, que puede comer sin restricciones los grupos de alimentos que son naturalmente libres de grasas saturadas y Neurosurgeon trans, entre los que se incluyen el pescado, las frutas (excepto el [place]), verduras, frijoles, cereales (cebada, Harlon Ditty, [place], [place]) y  las pastas (sin salsas con crema)  IDENTIFIQUE LOS ALIMENTOS QUE DISMINUYEN EL COLESTEROL  Pueden disminuir el colesterol las fibras solubles que estn en las frutas, como las [place], en los vegetales como el brcoli, las patatas y las zanahorias; en las legumbres como frijoles, guisantes y Therapist, occupational; y en los cereales como la cebada. Los alimentos fortificados con fitosteroles tambin Engineer, production. Debe consumir al menos 2 g de estos alimentos a diario para Financial planner de disminucin de [place].  En el supermercado, lea las etiquetas de los envases para identificar los alimentos bajos en grasas saturadas, libres de grasas trans y bajos en [place], . Elija quesos que tengan solo de 2 a 3 g de grasa saturada por onza (28,35 g). Use una margarina que no dae el corazn, [place] de grasas trans o aceite parcialmente hidrogenado. Al comprar alimentos horneados (galletitas dulces y G[place]) evite el aceite parcialmente hidrogenado. Los panes y bollos debern ser de  granos enteros (harina de maz o de avena entera, en lugar de "harina" o "harina enriquecida"). Compre sopas en lata que no sean cremosas, con bajo contenido de sal y sin grasas adicionadas.  TCNICAS DE PREPARACIN DE LOS ALIMENTOS  Nunca fra los alimentos en aceite abundante. Si debe frer, hgalo en poco aceite y removiendo [place], porque as se utilizan muy pocas grasas, o utilice un spray antiadherente. Cuando le sea posible, hierva, hornee o ase las carnes y cocine los vegetales al vapor. En vez de [employer] con mantequilla o [place], utilice limn y hierbas, pur de Psychologist, educational y canela (para las calabazas y batatas), yogurt y salsa descremados y aderezos para ensaladas bajos en contenido graso.  BAJO EN GRASAS SATURADAS / SUSTITUTOS BAJOS EN GRASA  Carnes / Grasas saturadas (g)  Evite: Bife, corte graso (3 oz/85 g) / 11 g   Elija: Bife, corte magro (3 oz/85 g) / 4 g   Evite: Hamburguesa (3 oz/85 g) / 7 g   Elija:  [place] magra (3 oz/85 g) / 5 g   Evite: Jamn (3 oz/85 g) / 6 g   Elija:  Jamn magro (3 oz/85 g) / 2.4 g   Evite: Pollo, con piel (3 oz/85 g), Carne oscura / 4 g   Elija:  Pollo, sin piel (3 oz/85 g), Carne oscura / 2 g   Evite: Pollo, con piel (3 oz/85 g), Carne magra / 2.5 g   Elija: Pollo, sin piel (3 oz/85 g), Carne magra / 1 g  Lcteos / Grasas saturadas (g)  Evite: Leche entera (1 taza) / 5 g   Elija: Leche con bajo contenido de grasa, 2% (1 taza) / 3 g   Elija: Leche con bajo contenido de grasa, 1% (1 taza) / 1.5 g   Elija: Leche descremada (1 taza) / 0.3 g   Evite: Queso duro (1 oz/28 g) / 6 g   Elija: Queso descremado (1 oz/28 g) / 2-3 g   Evite: Queso cottage, 4% grasa (1 taza)/ 6.5 g   Elija: Queso cottage con bajo contenido de grasa, 1% grasa (1 taza)/ 1.5 g   Evite: Helado (1 taza) / 9 g   Elija: Sorbete (1 taza) / 2.5 g   Elija: Yogurt helado sin contenido de grasa (1 taza) / 0.3 g   Elija: Barras de fruta  congeladas / vestigios   Evite: Crema batida (1 cucharada) / 3.5 g   Elija: Batidos glac sin lcteos (1 cucharada) / 1 g  Condimentos / Rosalin Hawking  saturadas (g)  Evite: Mayonesa (1 cucharada) / 2 g   Elija: Mayonesa con bajo contenido de grasa (1 cucharada) / 1 g   Evite: [place]eca (1 cucharada) / 7 g   Elija: Margarina extra light (1 cucharada) / 1 g   Evite: Aceite de coco (1 cucharada) / 11.8 g   Elija: Aceite de oliva (1 cucharada) / 1.8 g   Elija: Aceite de maz (1 cucharada) / 1.7 g   Elija: Aceite de crtamo (1 cucharada) / 1.2 g   Elija: Aceite de girasol (1 cucharada) / 1.4 g   Elija: Aceite de soja (1 cucharada) / 2.4 g   Elija: Aceite de canola (1 cucharada) / 1 g  Document Released: 09/24/2005 Document Revised: 06/06/2011 [facility] Patient Information 2012 [place], [place].  Dermatitis de contacto (Contact Dermatitis) La dermatitis de contacto es una reaccin a ciertas sustancias que tocan la piel. Puede ser Neomia Dear dermatitis de contacto irritante o alrgica. La dermatitis de contacto irritante no requiere exposicin previa a la sustancia que provoc la reaccin.La dermatitis alrgica slo ocurre si ha estado expuesto anteriormente a la sustancia. Al repetir la exposicin, el organismo reacciona a la sustancia.  CAUSAS  Muchas sustancias pueden causar dermatitis de contacto. La dermatitis irritante se produce cuando hay exposicin repetida a sustancias levemente irritantes, como por ejemplo:   Maquillaje.   Jabones.   Detergentes.   Lavandina.   cidos.   Sales metlicas, como el nquel.  Las causas de la dermatitis alrgica son:   Plantas venenosas.   Sustancias qumicas (desodorantes, champs).   Bijouterie.   Ltex.   Neomicina en cremas con triple antibitico.   Conservantes en productos incluyendo en la ropa.  SNTOMAS  En la zona de la piel que ha estado expuesta puede haber:   Sequedad o descamacin.   Enrojecimiento.   Grietas.    Picazn.   Dolor o sensacin de ardor.   Ampollas.  En el caso de la dermatitis de Engineer, technical sales, puede haber slo hinchazn en algunas zonas, como la boca o los genitales.  DIAGNSTICO  El mdico podr hacer el diagnstico realizando un examen fsico. En los casos en que la causa es incierta y se sospecha una dermatitis de [place], le har una prueba en la piel con un parche para determinar la causa de la dermatitis. TRATAMIENTO  El tratamiento incluye la proteccin de la piel de nuevos contactos con la sustancia irritante, evitando la sustancia en lo posible. Puede ser de utilidad colocar una barrera como cremas, polvos y [place]. El mdico tambin podr recomendar:   Cremas o pomadas con corticoides aplicadas 2 veces por da. Para un mejor efecto, humedezca la zona con agua fresca durante 20 minutos. Luego aplique el medicamento. Cubra la zona con un vendaje plstico. Puede almacenar la crema con corticoides en el refrigerador para Garment/textile technologist "refrescante" sobre la erupcin que har aliviar la picazn. Esto aliviar la picazn. En los casos ms graves ser necesario aplicar corticoides por va oral.   Ungentos con antibiticos o antibacterianos, si hay una infeccin en la piel.   Antihistamnicos en forma de locin o por va oral para calmar la picazn.   Lubricantes para mantener la humectacin de la piel.   La solucin de [place] para reducir el enrojecimiento y Chief Technology Officer o para secar una erupcin que supura. Mezcle un paquete o tableta en dos tazas de agua fra. Moje un pao limpio en la solucin, escrralo un poco y colquelo en el rea afectada. Djelo  en el lugar durante 30 minutos. Repita el procedimiento todas las veces que pueda a lo largo del Futures trader.   Hgase baos con almidn o bicarbonato todos los [address]  [address] si la zona es demasiado extensa como para cubrirla con una toallita.  Algunas sustancias qumicas, como los lcalis o los cidos pueden daar la piel del mismo modo que  [place]. Enjuague la piel durante 15 a 20 minutos con agua fra despus de la exposicin a esas sustancias. Tambin busque atencin mdica de inmediato. En los casos de piel muy irritada, ser necesario aplicar (vendajes), antibiticos y analgsicos.  INSTRUCCIONES PARA EL CUIDADO EN EL HOGAR   Evite lo que ha causado la erupcin.   Mantenga el rea de la piel afectada sin contacto con el agua caliente, el jabn, la luz solar, las sustancias qumicas, sustancias cidas o todo lo que la irrite.   No se rasque la lesin. El rascado puede hacer que la erupcin se infecte.   Puede tomar baos con agua fresca para detener la picazn.   Tome slo medicamentos de venta libre o recetados, segn las indicaciones del mdico.   Oceanographer a las visitas de control segn las indicaciones, para asegurarse de que la piel se est curando Audiological scientist.  SOLICITE ATENCIN MDICA SI:   El problema no mejora luego de [address]  [address] de [place].   Se siente empeorar.   Observa signos de infeccin, como hinchazn, sensibilidad, inflamacin, enrojecimiento o aumenta la temperatura en la zona afectada.   Tiene nuevos problemas debido a los medicamentos.  Document Released: 07/04/2005 Document Revised: 09/13/2011 [facility] Patient Information 2012 [place], [place].

## 2012-06-06 ENCOUNTER — Encounter: Payer: Self-pay | Admitting: Gynecology

## 2012-06-06 ENCOUNTER — Other Ambulatory Visit: Payer: Self-pay | Admitting: Anesthesiology

## 2012-06-06 DIAGNOSIS — R739 Hyperglycemia, unspecified: Secondary | ICD-10-CM

## 2012-06-06 DIAGNOSIS — R7989 Other specified abnormal findings of blood chemistry: Secondary | ICD-10-CM

## 2012-06-06 LAB — URINALYSIS W MICROSCOPIC + REFLEX CULTURE
Casts: NONE SEEN
Crystals: NONE SEEN
Nitrite: NEGATIVE
Specific Gravity, Urine: 1.011 (ref 1.005–1.030)
Urobilinogen, UA: 0.2 mg/dL (ref 0.0–1.0)
pH: 6 (ref 5.0–8.0)

## 2012-06-06 LAB — HEMOGLOBIN A1C
Hgb A1c MFr Bld: 5.7 % — ABNORMAL HIGH
Mean Plasma Glucose: 117 mg/dL — ABNORMAL HIGH

## 2012-06-10 ENCOUNTER — Other Ambulatory Visit: Payer: BC Managed Care – PPO

## 2012-06-10 DIAGNOSIS — R739 Hyperglycemia, unspecified: Secondary | ICD-10-CM

## 2012-06-10 DIAGNOSIS — R7989 Other specified abnormal findings of blood chemistry: Secondary | ICD-10-CM

## 2012-06-10 LAB — TSH: TSH: 22.468 u[IU]/mL — ABNORMAL HIGH (ref 0.350–4.500)

## 2012-06-10 LAB — T4: T4, Total: 3.4 ug/dL — ABNORMAL LOW (ref 5.0–12.5)

## 2012-06-10 LAB — T3 UPTAKE: T3 Uptake: 30.6 % (ref 22.5–37.0)

## 2012-06-10 LAB — GLUCOSE, RANDOM: Glucose, Bld: 100 mg/dL — ABNORMAL HIGH (ref 70–99)

## 2012-06-13 ENCOUNTER — Encounter: Payer: Self-pay | Admitting: Gynecology

## 2012-06-13 ENCOUNTER — Ambulatory Visit (INDEPENDENT_AMBULATORY_CARE_PROVIDER_SITE_OTHER): Payer: BC Managed Care – PPO | Admitting: Gynecology

## 2012-06-13 VITALS — BP 126/74

## 2012-06-13 DIAGNOSIS — E039 Hypothyroidism, unspecified: Secondary | ICD-10-CM

## 2012-06-13 MED ORDER — LEVOTHYROXINE SODIUM 25 MCG PO TABS: 25.0000 ug | ORAL_TABLET | Freq: Every day | ORAL | Status: DC

## 2012-06-13 NOTE — Progress Notes (Signed)
Patient is a 36 year old who was seen the office on 06/05/2012 for annual exam. Patient been complaining of weight gain tiredness fatigue and her thyroid function test was ordered and was found to be elevated and was repeated to confirm the diagnosis of hypothyroidism results as follows:  06/05/2012 TSH 16.440 (normal 0.350-4.500)  06/10/2012 TSH 22.468, T4 3 0.4 (normal 5.0-12.5), T3 uptake 30.6 (normal 12.5-37.0%)  The remainder of her lab work demonstrated a normal CBC and urinalysis and blood sugar 100 (hemoglobin A1c 5.7) in a fasting state.  Patient's neck examination did not demonstrate any palpable masses or thyromegaly or carotid bruits  We had a lengthy discussion on her diagnoses of hypothyroidism contributing to her symptoms. Patient with past history of abdominal hysterectomy. She will need to be supplemented with Synthroid and we'll start her at the lowest dose of 25 mcg daily and have her return to the office in 4-6 weeks for followup TSH level. The risks benefits and pros and cons of thyroid medication was discussed with the patient and literature information in Spanish was provided. 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  All the above was discussed in detail with the patient and all questions were answered and we will follow accordingly.

## 2012-06-13 NOTE — Patient Instructions (Addendum)
Hipotiroidismo (Hypothyroidism) La tiroides es una glndula grande ubicada en la parte anterior e inferior del cuello. La glndula tiroides interviene en el control del metabolismo. El metabolismo es el modo en que el organismo utiliza los alimentos. El control del metabolismo se realiza a travs de una hormona denominada tiroxina. Cuando la actividad de esta glndula est por debajo de lo normal (hipotiroidismo) produce muy poca cantidad de hormona. CAUSAS Aqu se incluyen:   Ausencia de tejido tiroideo.   Bocio por dficit de yodo.   Bocio por medicamentos.   Defectos congnitos (desde el nacimiento).   Trastornos de la glndula pituitaria Esto ocasiona la falta de TSH (sigla que significa hormona estimulante de la tiroides) Esta hormona le informa a la tiroides que debe producir ms hormona.  SNTOMAS  Letargia (sentir que no se tiene energa)   Intolerancia al fro   Aumento de peso (a pesar de una ingesta normal de alimentos)   Piel seca   Cabello seco   Irregularidades menstruales   Enlentecimiento de los procesos de pensamiento  La insuficiente cantidad de hormona tiroidea tambin puede ocasionar problemas cardacos. El hipotiroidismo en el recin nacido es el cretinismo en su forma extrema. Es importante que esta forma se trate de modo adecuado e inmediato, ya que puede conducir rpidamente al retardo del desarrollo fsico y mental. DIAGNSTICO Para comprobar la existencia de hipotiroidismo, el profesional le solicitar anlisis de sangre y radiografas y estudios con ultrasonido. Muchas veces los signos estn ocultos. Es necesario que el profesional vigile la enfermedad con anlisis de sangre. Esto se realiza luego de establecer un diagnstico (determinar cul es el problema). Puede ser necesario que el profesional que lo asiste controle esta enfermedad con anlisis de sangre ya sea antes o despus del diagnstico y el tratamiento. TRATAMIENTO Los niveles bajos de hormona  tiroidea se incrementan con el uso de hormona tiroidea sinttica. Este es un tratamiento seguro y efectivo. Se dispone de hormona tiroidea sinttica para el tratamiento de este trastorno. Generalmente lleva algunas semanas obtener el efecto total de los medicamentos. Luego de obtener el efecto completo del medicamento, habitualmente pasan otras cuatro semanas para que los sntomas empiezan a desaparecer. El profesional podr comenzar indicndole dosis bajas. Si usted tuvo problemas cardacos, la dosis se aumentar de manera gradual. Podr volver a lo normal sin entrar en una situacin de emergencia. INSTRUCCIONES PARA EL CUIDADO DOMICILIARIO  Tome los medicamentos como le ha indicado el profesional que lo asiste. Infrmele al profesional todos los medicamentos que toma o que ha comenzado a tomar. El profesional que lo asiste lo ayudar con los esquemas de las dosis.   A medida que obtiene mejora, es necesario aumentar la dosis. Ser necesario realizar continuos anlisis de sangre, segn lo indique el profesional.   Informe acerca de todos los efectos secundarios que sospeche que podran deberse a los medicamentos.  SOLICITE ATENCIN MDICA SI: Solicite atencin mdica si observa:  Sudoracin.   Temblores.   Ansiedad.   Rpida prdida de peso.   Intolerancia al calor.   Cambios emocionales.   Diarrea.   Debilidad.  SOLICITE ATENCIN MDICA DE INMEDIATO SI: Presenta dolor en el pecho, una frecuencia cardaca irregular (palpitaciones) o latidos cardacos rpidos. EST SEGURO QUE:   Comprende las instrucciones para el alta mdica.   Controlar su enfermedad.   Solicitar atencin mdica de inmediato segn las indicaciones.  Document Released: 09/24/2005 Document Revised: 09/13/2011 [employer] Patient Information 2012 [employer]. 

## 2012-07-30 ENCOUNTER — Ambulatory Visit: Payer: BC Managed Care – PPO | Admitting: Gynecology

## 2012-07-30 DIAGNOSIS — E039 Hypothyroidism, unspecified: Secondary | ICD-10-CM

## 2012-07-30 LAB — TSH: TSH: 10.349 u[IU]/mL — ABNORMAL HIGH (ref 0.350–4.500)

## 2012-08-01 ENCOUNTER — Other Ambulatory Visit: Payer: Self-pay | Admitting: Anesthesiology

## 2012-08-01 MED ORDER — LEVOTHYROXINE SODIUM 50 MCG PO TABS: 50.0000 ug | ORAL_TABLET | Freq: Every day | ORAL | Status: DC

## 2012-09-17 ENCOUNTER — Encounter: Payer: Self-pay | Admitting: Gynecology

## 2012-09-17 ENCOUNTER — Ambulatory Visit (INDEPENDENT_AMBULATORY_CARE_PROVIDER_SITE_OTHER): Payer: BC Managed Care – PPO | Admitting: Gynecology

## 2012-09-17 VITALS — BP 110/70

## 2012-09-17 DIAGNOSIS — A499 Bacterial infection, unspecified: Secondary | ICD-10-CM

## 2012-09-17 DIAGNOSIS — A64 Unspecified sexually transmitted disease: Secondary | ICD-10-CM

## 2012-09-17 DIAGNOSIS — N760 Acute vaginitis: Secondary | ICD-10-CM

## 2012-09-17 DIAGNOSIS — B9689 Other specified bacterial agents as the cause of diseases classified elsewhere: Secondary | ICD-10-CM

## 2012-09-17 DIAGNOSIS — E039 Hypothyroidism, unspecified: Secondary | ICD-10-CM

## 2012-09-17 DIAGNOSIS — N898 Other specified noninflammatory disorders of vagina: Secondary | ICD-10-CM

## 2012-09-17 LAB — WET PREP FOR TRICH, YEAST, CLUE: Yeast Wet Prep HPF POC: NONE SEEN

## 2012-09-17 MED ORDER — METRONIDAZOLE 500 MG PO TABS: 500.0000 mg | ORAL_TABLET | Freq: Two times a day (BID) | ORAL | Status: DC

## 2012-09-17 NOTE — Progress Notes (Signed)
Patient presented to the office today complaining of vaginal discharge for 2 weeks. White colored appearance. Very minimal pruritus. Patient is sexually active. No change in sexual partners. Patient with past history of total abdominal hysterectomy. Patient does have past history of hypothyroidism. Patient was supposed to return back for followup thyroid function tests after adjustment of her levothyroxin and she's going to get that drawn today. Her past thyroid function test results as follows:  06/05/2012 TSH 16.440 (normal 0.350-4.500)  06/10/2012 TSH 22.468, T4 3 0.4 (normal 5.0-12.5), T3 uptake 30.6 (normal 12.5-37.0%)  On 06/13/2012 she was started on levothyroxine 25 mcg daily On September 23 TSH at come down to a level of 10.349 and her levothyroxin was increased to 50 mcg daily and she did not return for followup lab testing until today.  Exam: Bartholin urethra Skene was within normal limits Vagina: Fishy like odor discharge was noted clear Cervix: No gross lesions on inspection Bimanual exam: Not done rectal exam: Not done  GC and Chlamydia culture obtained results pending at time of this dictation  Wet prep moderate clue cells, too numerous to count bacteria, positive Amine  Assessment/plan: #1 hypothyroidism medication adjustment recently followup thyroid function tests drawn today #2 bacterial vaginosis prescription for Flagyl 500 mg one by mouth twice a day for 5 days. #3 GC and Chlamydia culture pending at time of this dictation

## 2012-09-17 NOTE — Patient Instructions (Signed)
Vaginosis bacteriana (Bacterial Vaginosis) La vaginosis bacteriana es una infeccin vaginal en la que el equilibrio normal de las bacterias de la vagina se modifica. Este equilibrio normal se ve afectado por un desarrollo excesivo de ciertas bacterias. Hay diferentes tipos de bacteria que causan la vaginosis bacteriana. Es el problema vaginal ms comn en las mujeres de edad frtil. CAUSAS  La causa de este trastorno no se conoce bien. Se produce como consecuencia de un aumento o desequilibrio de las bacterias nocivas.  Algunas actividades o conductas pueden poner en peligro el equilibrio normal de las bacterias en la vagina, y aumentar el riesgo. Entre ellas:  Tener un compaero sexual o mltiples compaeros sexuales.  Las duchas vaginales  Usar un dispositivo intrauterino (DIU) como mtodo anticonceptivo.  No se conoce el papel que juega la actividad sexual en el desarrollo de una VB. Sin embargo, las mujeres que nunca tuvieron relaciones sexuales raramente se infectan. El contagio no se produce en asientos de baos, camas, piscinas o por tocar objetos.  SNTOMAS  Flujo vaginal grisceo.  Olor parecido al pescado con la secrecin, en especial despus de tener relaciones sexuales.  Picazn o irritacin de la vagina y la vulva.  Ardor o dolor al orinar.  Algunas mujeres no presentan ningn sntoma. DIAGNSTICO El mdico realizar un examen vaginal para diagnosticar una vaginosis bacteriana. El mdico le indicar anlisis de laboratorio y observar las muestras del lquido vaginal en el microscopio. Buscar bacterias y clulas anormales (clulas clave), pH mayor a 4.5 y una prueba de aminas positivo, todos ellos asociados al BV.  RIESGOS Y COMPLICACIONES  Enfermedad plvica inflamatoria (EPI).  Infecciones luego de una ciruga ginecolgica.  VIH.  Virus del Herpes TRATAMIENTO En algunos casos, la infeccin desaparece sin tratamiento. Sin embargo, todas las mujeres con sntomas  de VB deben tratarse para evitar complicaciones, especialmente si se ha planificado una ciruga ginecolgica. Los compaeros varones generalmente no necesitan tratamiento. Sin embargo, puede contagiarse entre parejas femeninas, de modo que el tratamiento se realiza para evitar recurrencias.   La VB puede tratarse con medicamentos que destruyen grmenes (antibiticos). Estos se presentan en pldoras o en cremas vaginales. Tanto mujeres embarazadas como no embarazadas pueden usar ambos, pero se indican en dosis diferentes. Estos antibiticos no daan al beb.  La VB puede recurrir luego del tratamiento. Si esto ocurre, se prescribir un segundo tratamiento con antibiticos.  El tratamiento es importante en el caso de las mujeres embarazadas. Si no se trata, la VB puede causar parto prematuro, especialmente en una mujer que ha tenido un parto prematuro en el pasado. Todas las mujeres embarazadas que tienen sntomas de VB deben ser controladas y tratadas.  En los casos de recurrencia crnica, se prescribe un tratamiento con un gel vaginal dos veces por semana INSTRUCCIONES PARA EL CUIDADO DOMICILIARIO  Tome los medicamentos que le indic el mdico.  No mantenga relaciones sexuales hasta completar el tratamiento.  Comunique a sus compaeros sexuales que sufre una infeccin vaginal. Ellos deben concurrir para un control mdico si tienen problemas como una urticaria leve o picazn.  Practique el sexo seguro. Use preservativos. Tenga un solo compaero sexual. PREVENCIN Algunos pasos bsicos de prevencin pueden ayudar a reducir el riesgo de desequilibrio de las bacterias vaginales y de sufrir VB.  No mantener relaciones sexuales (abstinencia)  No utilice duchas vaginales.  Utilice todos los medicamentos que le han prescripto para el tratamiento, aunque los sntomas hayan desaparecido.  Comunique a su compaero sexual que sufre una VB. De ese modo   podr tratase, si es necesario, y podr evitar una  recurrencia. SOLICITE ATENCIN MDICA SI:  Los sntomas no mejoran luego de 3 das de tratamiento.  Aumentan la secrecin, el dolor o la fiebre. ASEGRESE QUE:   Comprende estas instrucciones.  Controlar su enfermedad.  Solicitar ayuda de inmediato si no mejora o empeora. PARA MS INFORMACIN: Division de STD Prevention (DSTDP), Centers for Disease Control and Prevention (Centros para el control y la prevencin de enfermedades, CDC): www.cdc.gov/std American Social Health Association (ASHA): www.ashastd.org  Document Released: 01/01/2008 Document Revised: 12/17/2011 [employer] Patient Information 2013 [employer].  

## 2012-09-18 LAB — GC/CHLAMYDIA PROBE AMP
CT Probe RNA: NEGATIVE
GC Probe RNA: NEGATIVE

## 2012-09-18 LAB — THYROID PANEL WITH TSH
Free Thyroxine Index: 1.5 (ref 1.0–3.9)
TSH: 4.675 u[IU]/mL — ABNORMAL HIGH (ref 0.350–4.500)

## 2012-12-10 ENCOUNTER — Ambulatory Visit (INDEPENDENT_AMBULATORY_CARE_PROVIDER_SITE_OTHER): Payer: BC Managed Care – PPO | Admitting: Gynecology

## 2012-12-10 ENCOUNTER — Encounter: Payer: Self-pay | Admitting: Gynecology

## 2012-12-10 VITALS — BP 120/78

## 2012-12-10 DIAGNOSIS — E039 Hypothyroidism, unspecified: Secondary | ICD-10-CM

## 2012-12-10 DIAGNOSIS — N898 Other specified noninflammatory disorders of vagina: Secondary | ICD-10-CM

## 2012-12-10 DIAGNOSIS — B9689 Other specified bacterial agents as the cause of diseases classified elsewhere: Secondary | ICD-10-CM

## 2012-12-10 DIAGNOSIS — N760 Acute vaginitis: Secondary | ICD-10-CM

## 2012-12-10 DIAGNOSIS — A499 Bacterial infection, unspecified: Secondary | ICD-10-CM

## 2012-12-10 LAB — WET PREP FOR TRICH, YEAST, CLUE

## 2012-12-10 LAB — TSH: TSH: 2.09 u[IU]/mL (ref 0.350–4.500)

## 2012-12-10 MED ORDER — METRONIDAZOLE 0.75 % VA GEL: 1.0000 | Freq: Two times a day (BID) | VAGINAL | Status: DC

## 2012-12-10 NOTE — Patient Instructions (Signed)
Vaginosis bacteriana (Bacterial Vaginosis) La vaginosis bacteriana es una infeccin vaginal en la que el equilibrio normal de las bacterias de la vagina se modifica. Este equilibrio normal se ve afectado por un desarrollo excesivo de ciertas bacterias. Hay diferentes tipos de bacteria que causan la vaginosis bacteriana. Es el problema vaginal ms comn en las mujeres de edad frtil. CAUSAS  La causa de este trastorno no se conoce bien. Se produce como consecuencia de un aumento o desequilibrio de las bacterias nocivas.  Algunas actividades o conductas pueden poner en peligro el equilibrio normal de las bacterias en la vagina, y aumentar el riesgo. Entre ellas:  Tener un compaero sexual o mltiples compaeros sexuales.  Las duchas vaginales  Usar un dispositivo intrauterino (DIU) como mtodo anticonceptivo.  No se conoce el papel que juega la actividad sexual en el desarrollo de una VB. Sin embargo, las mujeres que nunca tuvieron relaciones sexuales raramente se infectan. El contagio no se produce en asientos de baos, camas, piscinas o por tocar objetos.  SNTOMAS  Flujo vaginal grisceo.  Olor parecido al pescado con la secrecin, en especial despus de tener relaciones sexuales.  Picazn o irritacin de la vagina y la vulva.  Ardor o dolor al orinar.  Algunas mujeres no presentan ningn sntoma. DIAGNSTICO El mdico realizar un examen vaginal para diagnosticar una vaginosis bacteriana. El mdico le indicar anlisis de laboratorio y observar las muestras del lquido vaginal en el microscopio. Buscar bacterias y clulas anormales (clulas clave), pH mayor a 4.5 y una prueba de aminas positivo, todos ellos asociados al BV.  RIESGOS Y COMPLICACIONES  Enfermedad plvica inflamatoria (EPI).  Infecciones luego de una ciruga ginecolgica.  VIH.  Virus del Herpes TRATAMIENTO En algunos casos, la infeccin desaparece sin tratamiento. Sin embargo, todas las mujeres con sntomas  de VB deben tratarse para evitar complicaciones, especialmente si se ha planificado una ciruga ginecolgica. Los compaeros varones generalmente no necesitan tratamiento. Sin embargo, puede contagiarse entre parejas femeninas, de modo que el tratamiento se realiza para evitar recurrencias.   La VB puede tratarse con medicamentos que destruyen grmenes (antibiticos). Estos se presentan en pldoras o en cremas vaginales. Tanto mujeres embarazadas como no embarazadas pueden usar ambos, pero se indican en dosis diferentes. Estos antibiticos no daan al beb.  La VB puede recurrir luego del tratamiento. Si esto ocurre, se prescribir un segundo tratamiento con antibiticos.  El tratamiento es importante en el caso de las mujeres embarazadas. Si no se trata, la VB puede causar parto prematuro, especialmente en una mujer que ha tenido un parto prematuro en el pasado. Todas las mujeres embarazadas que tienen sntomas de VB deben ser controladas y tratadas.  En los casos de recurrencia crnica, se prescribe un tratamiento con un gel vaginal dos veces por semana INSTRUCCIONES PARA EL CUIDADO DOMICILIARIO  Tome los medicamentos que le indic el mdico.  No mantenga relaciones sexuales hasta completar el tratamiento.  Comunique a sus compaeros sexuales que sufre una infeccin vaginal. Ellos deben concurrir para un control mdico si tienen problemas como una urticaria leve o picazn.  Practique el sexo seguro. Use preservativos. Tenga un solo compaero sexual. PREVENCIN Algunos pasos bsicos de prevencin pueden ayudar a reducir el riesgo de desequilibrio de las bacterias vaginales y de sufrir VB.  No mantener relaciones sexuales (abstinencia)  No utilice duchas vaginales.  Utilice todos los medicamentos que le han prescripto para el tratamiento, aunque los sntomas hayan desaparecido.  Comunique a su compaero sexual que sufre una VB. De ese modo   podr tratase, si es necesario, y podr evitar una  recurrencia. SOLICITE ATENCIN MDICA SI:  Los sntomas no mejoran luego de 3 das de tratamiento.  Aumentan la secrecin, el dolor o la fiebre. ASEGRESE QUE:   Comprende estas instrucciones.  Controlar su enfermedad.  Solicitar ayuda de inmediato si no mejora o empeora. PARA MS INFORMACIN: Division de STD Prevention (DSTDP), Centers for Disease Control and Prevention (Centros para el control y la prevencin de enfermedades, CDC): www.cdc.gov/std American Social Health Association (ASHA): www.ashastd.org  Document Released: 01/01/2008 Document Revised: 12/17/2011 [employer] Patient Information 2013 [employer].  

## 2012-12-10 NOTE — Progress Notes (Signed)
Patient presented the office today complaining of vaginal discharge and irritation. Patient with past history abdominal hysterectomy. Patient was also diagnosed with hypothyroidism and we had been titrating her thyroid medication. Her thyroid function tests were as follows:   06/05/2012 TSH 16.440 (normal 0.350-4.500)  06/10/2012 TSH 22.468, T4 3 0.4 (normal 5.0-12.5), T3 uptake 30.6 (normal 12.5-37.0%)   On 06/13/2012 she was started on levothyroxine 25 mcg daily  On September 23 TSH at come down to a level of 10.349 and her levothyroxin was increased to 50 mcg daily   followup TSH on 09/17/2012 with a value of 4.675. Patient is doing well otherwise and states that she's eating better exercising having more energy and started to lose weight.  Wet prep: Positive Amine, too numerous to count bacteria, many clue cells  Assessment/plan: Problem #1: Patient with hypothyroidism responding to Synthroid 50 mcg daily. We will go ahead and check her TSH level today. Problem #2 bacterial vaginosis:we'll treat her with MetroGel vaginal cream to apply each bedtime for 5-7 days.

## 2013-02-24 ENCOUNTER — Ambulatory Visit (INDEPENDENT_AMBULATORY_CARE_PROVIDER_SITE_OTHER): Payer: BC Managed Care – PPO | Admitting: Gynecology

## 2013-02-24 ENCOUNTER — Encounter: Payer: Self-pay | Admitting: Gynecology

## 2013-02-24 VITALS — BP 126/70

## 2013-02-24 DIAGNOSIS — N760 Acute vaginitis: Secondary | ICD-10-CM

## 2013-02-24 DIAGNOSIS — E039 Hypothyroidism, unspecified: Secondary | ICD-10-CM

## 2013-02-24 DIAGNOSIS — B9689 Other specified bacterial agents as the cause of diseases classified elsewhere: Secondary | ICD-10-CM

## 2013-02-24 DIAGNOSIS — N898 Other specified noninflammatory disorders of vagina: Secondary | ICD-10-CM

## 2013-02-24 DIAGNOSIS — A499 Bacterial infection, unspecified: Secondary | ICD-10-CM

## 2013-02-24 DIAGNOSIS — L293 Anogenital pruritus, unspecified: Secondary | ICD-10-CM

## 2013-02-24 LAB — WET PREP FOR TRICH, YEAST, CLUE
WBC, Wet Prep HPF POC: NONE SEEN
Yeast Wet Prep HPF POC: NONE SEEN

## 2013-02-24 MED ORDER — TINIDAZOLE 500 MG PO TABS: ORAL_TABLET | ORAL | Status: DC

## 2013-02-24 NOTE — Patient Instructions (Signed)
Vaginosis bacteriana (Bacterial Vaginosis) La vaginosis bacteriana es una infeccin en la vagina. En la vagina sana hay muchos tipos de grmenes (bacterias). En algunos casos la cantidad de bacterias buenas puede cambiar. Esto hace que los grmenes malos ingresen y causen una infeccin. Le indicarn medicamentos (antibioticos) para tratar la infeccin. O puede ser que no necesite tratamiento. ATENCIN EN EL [employer], aunque se sienta mejor.  No tenga relaciones sexuales hasta que finalice los medicamentos.  No se haga duchas vaginales.  Practique el sexo seguro.  Comunique a sus compaeros sexuales que sufre una infeccin. Si tienen problemas, ellos deben consultar a su mdico para iniciar un tratamiento. SOLICITE AYUDA DE INMEDIATO SI:  No mejora luego de 3 das de Abigail Reid.  Observa una secrecin (prdida) de color gris que proviene de la vagina.  Siente Radiographer, therapeutic.  La temperatura le sube a 102 F (38.9 C) o ms. ASEGRESE QUE:   Comprende estas instrucciones.  Controlar su enfermedad.  Solicitar ayuda inmediatamente si no mejora o si empeora. Document Released: 12/21/2008 Document Revised: 12/17/2011 [facility] Patient Information 2013 [place], [place].

## 2013-02-24 NOTE — Progress Notes (Signed)
Patient is a 37 year old who presented to the office today complaining of vulvar pruritus and a slight discharge. She denied any true dysuria, fever, chills, nausea or vomiting. Review of patient's record indicated in December 2013 she was treated for bacterial vaginosis with Flagyl 500 mg twice a day for 5 days. She returned back in March of 2014 and was diagnosed once again with bacterial vaginosis and placed on MetroGel each bedtime for 5 days. She is in a monogamous relationship. Patient did have positive Chlamydia culture in 2012. Patient is also had history in the past of vulvar intraepithelial neoplasia III.  Exam: Bartholin urethra Skene was within normal limits Vagina clear discharge with fishy odor noted Vaginal cuff intact Bimanual exam: Not done Rectal exam not done  Urinalysis 3-6 WBC, few bacteria urine sent for culture  Wet prep many clue cells and too numerous to count bacteria and positive amine  Assessment/plan: patient will be treated for recurrent BV with Tindamax  500 mg twice a day for 2 weeks.we'll also wait the results of the urine culture. Of note patient has been treated for hypothyroidism in her TSH on March 2014 was and the normal range it was instructed to continue her levothyroid 50 mcg daily.

## 2013-06-24 ENCOUNTER — Ambulatory Visit (INDEPENDENT_AMBULATORY_CARE_PROVIDER_SITE_OTHER): Payer: BC Managed Care – PPO | Admitting: Gynecology

## 2013-06-24 ENCOUNTER — Encounter: Payer: Self-pay | Admitting: Gynecology

## 2013-06-24 ENCOUNTER — Other Ambulatory Visit (HOSPITAL_COMMUNITY)
Admission: RE | Admit: 2013-06-24 | Discharge: 2013-06-24 | Disposition: A | Discharge status: Home / Self Care | Payer: BC Managed Care – PPO | Source: Ambulatory Visit | Attending: Gynecology | Admitting: Gynecology

## 2013-06-24 VITALS — BP 122/78 | Ht 61.5 in | Wt 168.0 lb

## 2013-06-24 DIAGNOSIS — Z23 Encounter for immunization: Secondary | ICD-10-CM

## 2013-06-24 DIAGNOSIS — L819 Disorder of pigmentation, unspecified: Secondary | ICD-10-CM

## 2013-06-24 DIAGNOSIS — Z01419 Encounter for gynecological examination (general) (routine) without abnormal findings: Secondary | ICD-10-CM

## 2013-06-24 DIAGNOSIS — E039 Hypothyroidism, unspecified: Secondary | ICD-10-CM

## 2013-06-24 DIAGNOSIS — L811 Chloasma: Secondary | ICD-10-CM

## 2013-06-24 LAB — CBC WITH DIFFERENTIAL/PLATELET
Basophils Relative: 0 % (ref 0–1)
Hemoglobin: 13.5 g/dL (ref 12.0–15.0)
Lymphocytes Relative: 24 % (ref 12–46)
MCHC: 33.5 g/dL (ref 30.0–36.0)
Monocytes Relative: 7 % (ref 3–12)
Neutro Abs: 8.1 10*3/uL — ABNORMAL HIGH (ref 1.7–7.7)
Neutrophils Relative %: 67 % (ref 43–77)
RBC: 4.45 MIL/uL (ref 3.87–5.11)
WBC: 12 10*3/uL — ABNORMAL HIGH (ref 4.0–10.5)

## 2013-06-24 NOTE — Patient Instructions (Addendum)
Vacuna difteria, tétanos, tos ferina (DTP) - Lo que debe saber   (Tetanus, Diphtheria, Pertussis [Tdap] Vaccine, What You Need to Know)  ¿PORQUÉ VACUNARSE?   El tétanos, la difteria y la tos ferina pueden ser enfermedades muy graves, aún en adolescentes y adultos. La vacuna Tdap nos puede proteger de estas enfermedades.   El TÉTANOS (Trismo) provoca la contracción dolorosa de los músculos, por lo general, en todo el cuerpo.   · Puede causar el endurecimiento de los músculos de la cabeza y el cuello, de modo que impide abrir la boca, tragar y en algunos casos, respirar. El tétanos causa la muerte de 1 de cada 5 personas que se infectan.  La DIFTERIA produce la formación de una membrana gruesa que cubre el fondo de la garganta.   · Puede causar problemas respiratorios, parálisis, insuficiencia cardíaca e incluso la muerte.  TOS FERINA (Pertusis) causa episodios de tos graves, que pueden hacer difícil la respiración, causar vómitos y trastornos del sueño.   · También puede ser la causa de pérdida de peso, incontinencia y fractura de costillas. Dos de cada 100 adolescentes y cinco de cada 100 adultos que enferman de pertusis deben ser hospitalizados, tienen complicaciones como la neumonía o mueren.  Estas enfermedades son provocadas por bacterias. La difteria y el pertusis se contagian de persona a persona a través de la tos o el estornudo. El tétanos ingresa al organismo a través de cortes, rasguños o heridas.   Antes de las vacunas, en los [place] se vieron más de 200.000 casos al año de difteria y tos ferina y cientos de casos de tétanos. Desde el inicio de la vacunación, los casos de tétanos y difteria han disminuido alrededor del 99% y los casos de tos ferina alrededor del 80%.   Tdap   La vacuna Tdap protege a adolescentes y adultos contra el tétanos, la difteria y la tos ferina. Una dosis de Tdap se administra a los 11 o 12 años de edad. Las personas que no recibieron la vacuna Tdap a esa edad deben  recibirla tan pronto como sea posible.   Es muy importante que los profesionales de la salud y todos aquellos que tengan contacto cercano con bebés menores de 12 meses reciban la Tdap.   Las mujeres embarazadas deben recibir una dosis de Tdap en cada embarazo, para proteger al recién nacido de la tos ferina. Los niños tienen mayor riesgo de complicaciones graves y potencialmente mortales debido a la tos ferina.   Una vacuna similar, llamada Td, protege contra el tétanos y la difteria, pero no contra la tos ferina. Cada 10 años debe recibirse un refuerzo de Td. La Tdap se puede administrar como uno de estos refuerzos, si todavía no ha recibido una dosis. También se puede aplicar después de un corte o quemadura grave para prevenir la infección por tétanos.   El médico le dará más información.   La Tdap puede administrarse de manera segura simultáneamente con otras vacunas.   ALGUNAS PERSONAS NO DEBEN RECIBIR ESTA VACUNA.   · Si alguna vez tuvo una reacción alérgica potencialmente mortal después de una dosis de la vacuna contra el tétanos, la diferia o la tos ferina, o tuvo una alergia grave a cualquiera de los componentes de esta vacuna, no debe aplicarse la vacuna. Informe a su médico si usted sufre algún tipo de alergia grave.  · Si estuvo en coma o sufrió múltiples convulsiones dentro de los 7 días posteriores después de una dosis de DTP o DTaP   su mdico si:  tiene epilepsia u otra enfermedad del sistema nervioso,  siente dolor intenso o se hincha despus de recibir cualquier vacuna contra la difteria, el ttanos o la tos ferina,  alguna vez ha sufrido el sndrome de Guillain-Barr,  no se siente bien el da en que se ha programado la vacuna. RIESGOS DE UNA REACCIN A LA VACUNA Con cualquier medicamento, incluyendo las vacunas, existe la posibilidad de que aparezcan efectos secundarios. Estos son  leves y desaparecen por s solos, pero tambin son posibles las reacciones graves.  Breves episodios de desmayo pueden seguir a una vacunacin, causando lesiones por la cada. Sentarse o recostarse durante 15 minutos puede ayudar a evitarlo. Informe al mdico si se siente mareado o aturdido, tiene cambios en la visin o zumbidos en los odos.  Problemas leves luego de la Tdap (no interferirn con las actividades)   Dolor en el sitio de la inyeccin (alrededor de 1 de cada 4 adolescentes o 2 de cada 3 adultos).  Enrojecimiento o hinchazn en el lugar de la inyeccin (1 de cada 5 personas).  Fiebre leve de al menos 100,4 F (38 C) (hasta alrededor de 1 cada 25 adolescentes y 1 de cada 100 adultos).  Dolor de cabeza (3 o 4de cada 10 personas).  Cansancio (1 de cada 3 o 4 personas).  Nuseas, vmitos, diarrea, dolor de estmago (1 de cada 4 adolescentes o 1 de cada 10 adultos).  Escalofros, dolores corporales, dolor articular, erupciones, inflamacin de las glndulas (poco frecuente). Problemas moderados: (interfieren con las actividades, pero no requieren atencin mdica)   Dolor en el lugar de la inyeccin (1 de cada 5 adolescentes o 1 de cada 100 adultos).  Enrojecimiento o inflamacin (1 de cada 16 adolescentes y 1 de cada 25 adultos).  Fiebre de ms de 102F o 38,9C (1 de cada 100 adolescentes o 1 de cada 250 adultos).  Dolor de cabeza (alrededor de 4 de cada 20 adolescentes y 3 de cada 10 adultos).  Nuseas, vmitos, diarrea, dolor de estmago (1 a 3 de cada 100 personas).  Hinchazn de todo el brazo en el que se aplic la vacuna (3 de cada100 personas). Problemas graves: luego de la Tdap (no puede realizar las actividades habituales, requiere atencin mdica)   Inflamacin, dolor intenso, sangrado y enrojecimiento en el brazo, en el sitio de la inyeccin (poco frecuente). Una reaccin alrgica grave puede ocurrir despus de la administracin de cualquier vacuna (se estima en  menos de 1 en un milln de dosis).  QU PASA SI HAY UNA REACCIN GRAVE?  Qu signos debo buscar?  Observe todo lo que le preocupe, como signos de una reaccin alrgica grave, fiebre muy alta o cambios en el comportamiento. Los signos de una reaccin alrgica grave pueden incluir urticaria, hinchazn de la cara y la garganta, dificultad para respirar, ritmo cardaco acelerado, mareos y debilidad. Estos sntomas pueden comenzar entre unos pocos minutos y algunas horas despus de la vacunacin.  Qu debo hacer?  Si usted piensa que se trata de una reaccin alrgica grave o de otra emergencia que no puede esperar, llame al 911 o lleve a la persona al hospital ms cercano. De lo contrario, llame a su mdico.  Despus, la reaccin debe informarse a la "Vaccine Adverse Event Reporting System" (Sistema de informacin sobre efectos adversos de las vacunas -VAERS). El mdico o usted mismo pueden realizar el informe en el sitio web del VAERS www.vaers.hhs.govo llame al 1-800-822-7967. El VAERS es slo para informar reacciones. No   brindan consejo mdico.  PROGRAMA NACIONAL DE COMPENSACIN DE DAOS POR VACUNAS  El National Vaccine Injury [employer] (VICP) es un programa federal que fue creado para compensar a las personas que puedan haber sufrido daos al recibir ciertas vacunas.  Aquellas personas que consideren que han sufrido un dao como consecuencia de una vacuna y quieren saber ms acerca del programa y como presentar Roslynn Amble, pueden llamar [phone] o visite el sitio web del VICP en SpiritualWord.at.  CMO PUEDO OBTENER MS INFORMACIN?   Consulte a su mdico.  Comunquese con el servicio de salud de su localidad o [address].  Comunquese con los Centros para el control y la prevencin de Child psychotherapist for Disease Control and Prevention , CDC).  llamando al [phone] o visitando el sitio web del CDC en PicCapture.uy. CDC Tdap Vaccine VIS  (02/14/12)  Document Released: 09/10/2012 [facility] Patient Information 2014 [place], [place]. Melasma (Melasma) El melasma es una enfermedad de la piel. No se transmite de [place] persona a otra (no es contagioso). Es una zona de tono bronceado o marrn que generalmente aparece en las [place], frente, parte superior del labio y cuello. Estas manchas pueden semejarse a [place]line [place]. La zona de diferente color no pica y no est roja ni hinchada. Generalmente ocurre en mujeres con una piel que se colorea (pigmenta) fcilmente y tambin en mujeres de color de piel marrn claro. Ocurre con frecuencia durante el embarazo, en mujeres menopusicas que reciben reemplazo hormonal, en enfermedades del hgado, o cuando se toman anticonceptivos orales y se exponen al sol. Tambin pueden sufrirlo los hombres y mujeres no embarazadas. Es ms frecuente en climas tropicales.  CAUSAS  Incremento de las clulas que producen pigmentos (melanocitos) en la piel.  Embarazo.  Uso de anticonceptivos orales.  Mujeres menopusicas que siguen un tratamiento de reemplazo hormonal.  Aumento de la exposicin al sol en mujeres de piel marrn claro.  Puede ser hereditario.  Nash Mantis a ciertos medicamentos o cosmticos.  Enfermedad tiroidea.  Enfermedad de [place] (prdida de la funcin de la glndula adrenal).  Estrs excesivo. SNTOMAS No hay otros sntomas ms que el color diferente de la piel, en manchas oscuras o de color tostado. DIAGNSTICO  El melasma se diagnostica basndose en la apariencia fsica.  Con la lmpara de Wood para [employer]. PREVENCIN Para disminuir el riesgo de sufrir este problema, deben evitarse los anticonceptivos orales, los tratamientos de reemplazo hormonal en la menopausia y la exposicin al sol.  PRONSTICO No tiene efectos a Air cabin crew. Puede ser de ayuda el uso de pantalla solar.  TRATAMIENTO  Cremas blanqueadoras, productos para el cuidado de la piel, peelings con cido  gliclico y Civil engineer, contracting que bloquee los rayos ultravioletas son de ayuda para el tratamiento de esta afeccin.  La piel oscurecida generalmente mejora luego del parto o cuando se suspende el uso de los anticonceptivos [place].  Los productos especficos para tratar este problema pueden tener efectos secundarios. Algunas personas tienen una reaccin alrgica leve a las cremas o blanqueadores.  Utilizar una combinacin de hidroquinona y cido gliclico, medicamentos de venta libre o prescriptos. Siga las indicaciones de su mdico.  Tretinona Debe evitarse durante el [place].  cido azelaico al 20%  Peeling facial con cido alfa hidroxido o peelings qumicos.  Tratamiento con rayos lser.  Puede usar Airline pilot. Evite la exposicin al sol durante cualquiera de estos tratamientos. INSTRUCCIONES PARA EL CUIDADO DOMICILIARIO  Si los problemas empeoran debe consultar con su mdico.  Evite la exposicin  excesiva al sol, especialmente en las zonas tropicales.  Use una pantalla solar fuerte cuando se encuentre al sol. SOLICITE ANTENCIN MDICA SI:  Le aparecen manchas en la piel y quiere controlarlas.  Quiere saber qu tipo de tratamiento puede seguir para Product/process development scientist.  Tiene manchas en la piel, y estas empeoran con o sin tratamiento.  Observa que las manchas de la piel sangran. Document Released: 03/12/2008 Document Revised: 12/17/2011 [facility] Patient Information 2014 [place], [place].

## 2013-06-24 NOTE — Progress Notes (Signed)
Abigail Reid May 03, 1976 [id number]   History:    37 y.o.  for annual gyn exam with occasional left breast tenderness depending on the time of her cycle. 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 Patient 2012 had a full STD screening which was negative. She has had history in the past of Chlamydia infection.  The patient was diagnosed with hypothyroidism and is currently on levothyroxine 50 mcg daily. Patient was concerned with some hyperpigmented areas on both groin and in her face. She is dark complexion and is from [place]. The patient has not received the Tdap vaccine as of yet.    Past medical history,surgical history, family history and social history were all reviewed and documented in the EPIC chart.  Gynecologic History Patient's last menstrual period was 03/28/2011. Contraception: status post hysterectomy Last Pap: 2013. Results were: normal Last mammogram: none indicated. Results were: none indicated  Obstetric History OB History  Gravida Para Term Preterm AB SAB TAB Ectopic Multiple Living  3 3 3       3     # Outcome Date GA Lbr Len/2nd Weight Sex Delivery Anes PTL Lv  3 TRM     F SVD  N Y  2 TRM     F SVD  N Y  1 TRM     F SVD  N Y       ROS: A ROS was performed and pertinent positives and negatives are included in the history.  GENERAL: No fevers or chills. HEENT: No change in vision, no earache, sore throat or sinus congestion. NECK: No pain or stiffness. CARDIOVASCULAR: No chest pain or pressure. No palpitations. PULMONARY: No shortness of breath, cough or wheeze. GASTROINTESTINAL: No abdominal pain, nausea, vomiting or diarrhea, melena or  bright red blood per rectum. GENITOURINARY: No urinary frequency, urgency, hesitancy or dysuria. MUSCULOSKELETAL: No joint or muscle pain, no back pain, no recent trauma. DERMATOLOGIC: No rash, no itching, no lesions. ENDOCRINE: No polyuria, polydipsia, no heat or cold intolerance. No recent change in weight. HEMATOLOGICAL: No anemia or easy bruising or bleeding. NEUROLOGIC: No headache, seizures, numbness, tingling or weakness. PSYCHIATRIC: No depression, no loss of interest in normal activity or change in sleep pattern.   facial pigmented areas along with pigmented areas on both groin  Exam: chaperone present  BP 122/78  Ht 5' 1.5" (1.562 m)  Wt 168 lb (76.204 kg)  BMI 31.23 kg/m2  LMP 03/28/2011  Body mass index is 31.23 kg/(m^2).  General appearance : Well developed well nourished female. No acute distress HEENT: Neck supple, trachea midline, no carotid bruits, no thyroidmegaly Lungs: Clear to auscultation, no rhonchi or wheezes, or rib retractions  Heart: Regular rate and rhythm, no murmurs or gallops Breast:Examined in sitting and supine position were symmetrical in appearance, no palpable masses or tenderness,  no skin retraction, no nipple inversion, no nipple discharge, no skin discoloration, no axillary or supraclavicular lymphadenopathy Abdomen: no palpable masses or tenderness, no rebound or guarding Extremities: no edema or skin discoloration or tenderness The  Melasma of face and groin Pelvic:  Bartholin, Urethra, Skene Glands: Within normal limits  Vagina: No gross lesions or discharge  Cervix: absent  Uterus Absent  Adnexa  Without masses or tenderness  Anus and perineum  normal   Rectovaginal  normal sphincter tone without palpated masses or tenderness             Hemoccult None indicated     Assessment/Plan:  37 y.o. female for annual exam with clinical evidence of melasma. The patient will be prescribed hydroquinone 1.5% cream to apply twice a day a  thin film. The patient was reminded to her monthly breast exam. She received a Tdap vaccine today. Pap smear also done today. Patient was weighing 179 pounds down to 168 pounds exercising using healthier. The following labs were ordered: CBC, screen cholesterol, comprehensive metabolic panel, TSH and urinalysis. Patient was reminded to do her monthly breast exams.    Ok Edwards MD, 3:24 PM 06/24/2013

## 2013-06-24 NOTE — Addendum Note (Signed)
Addended by: Bertram Savin A on: 06/24/2013 04:17 PM   Modules accepted: Orders

## 2013-06-25 ENCOUNTER — Other Ambulatory Visit: Payer: Self-pay | Admitting: Gynecology

## 2013-06-25 DIAGNOSIS — R3129 Other microscopic hematuria: Secondary | ICD-10-CM

## 2013-06-25 DIAGNOSIS — D72829 Elevated white blood cell count, unspecified: Secondary | ICD-10-CM

## 2013-06-25 LAB — URINALYSIS W MICROSCOPIC + REFLEX CULTURE
Bacteria, UA: NONE SEEN
Casts: NONE SEEN
Glucose, UA: NEGATIVE mg/dL
Ketones, ur: NEGATIVE mg/dL
pH: 7 (ref 5.0–8.0)

## 2013-06-25 LAB — COMPREHENSIVE METABOLIC PANEL
AST: 19 U/L (ref 0–37)
Albumin: 4.4 g/dL (ref 3.5–5.2)
Alkaline Phosphatase: 72 U/L (ref 39–117)
Glucose, Bld: 99 mg/dL (ref 70–99)
Potassium: 4.3 mEq/L (ref 3.5–5.3)
Sodium: 138 mEq/L (ref 135–145)
Total Protein: 7.5 g/dL (ref 6.0–8.3)

## 2013-06-25 LAB — CHOLESTEROL, TOTAL: Cholesterol: 180 mg/dL (ref 0–200)

## 2013-07-15 ENCOUNTER — Other Ambulatory Visit: Payer: BC Managed Care – PPO

## 2013-07-15 DIAGNOSIS — R3129 Other microscopic hematuria: Secondary | ICD-10-CM

## 2013-07-15 DIAGNOSIS — D72829 Elevated white blood cell count, unspecified: Secondary | ICD-10-CM

## 2013-07-15 LAB — CBC WITH DIFFERENTIAL/PLATELET
Eosinophils Relative: 2 % (ref 0–5)
HCT: 38.6 % (ref 36.0–46.0)
Lymphocytes Relative: 29 % (ref 12–46)
Lymphs Abs: 2.4 10*3/uL (ref 0.7–4.0)
MCV: 91.5 fL (ref 78.0–100.0)
Platelets: 350 10*3/uL (ref 150–400)
RBC: 4.22 MIL/uL (ref 3.87–5.11)
WBC: 8.5 10*3/uL (ref 4.0–10.5)

## 2013-07-16 LAB — URINALYSIS W MICROSCOPIC + REFLEX CULTURE
Leukocytes, UA: NEGATIVE
Nitrite: NEGATIVE
Protein, ur: NEGATIVE mg/dL

## 2013-08-05 ENCOUNTER — Telehealth: Payer: Self-pay | Admitting: *Deleted

## 2013-08-05 NOTE — Telephone Encounter (Signed)
Pt will see Dr.Nesi at [facility] urology on 08/20/13 @ 3:00 pm per note on 07/15/13. Debarah Crape will inform pt.

## 2013-08-25 ENCOUNTER — Other Ambulatory Visit: Payer: Self-pay | Admitting: Gynecology

## 2014-06-22 ENCOUNTER — Telehealth: Payer: Self-pay | Admitting: *Deleted

## 2014-06-22 ENCOUNTER — Ambulatory Visit (INDEPENDENT_AMBULATORY_CARE_PROVIDER_SITE_OTHER): Payer: BC Managed Care – PPO | Admitting: Women's Health

## 2014-06-22 ENCOUNTER — Encounter: Payer: Self-pay | Admitting: Women's Health

## 2014-06-22 DIAGNOSIS — N644 Mastodynia: Secondary | ICD-10-CM

## 2014-06-22 NOTE — Telephone Encounter (Signed)
Orders placed at breast center, they will contact pt to schedule. 

## 2014-06-22 NOTE — Telephone Encounter (Signed)
Message copied by Thamas Jaegers on Tue Jun 22, 2014 10:51 AM ------      Message from: [place], [place] J      Created: Tue Jun 22, 2014 10:41 AM       Needs bilateral mammogram/diagnostic for bilateral breast tenderness for past 4-6 weeks. No palpable changes with exam no nipple discharge no family history of breast cancer. /best time is Wednesday after 12 noon. speaks spanish ------

## 2014-06-22 NOTE — Progress Notes (Signed)
Presents with complaints of bilateral breast pain for the last 4- 6 weeks.  Pain is intermittent, middle, deep in breasts and has been experienced previously.   Does not recall any injury or change in routine.  Denies any nipple discharge.   Drinks caffeine daily, usually 2-3 servings.  Recently seen by dermatology for a pruritic rash on her chest and was treated with an antibiotic but does not know the name of the medicine and has follow up tomorrow .  No family history of breast cancer. Interpretator present.   TAH/fibroids.  Hypothyroidism and is currently on levothyroxine 50 mcg daily.  Exam:  Well-appearing.   Breasts examined lying and sitting.  Breasts mildly tender in the center of each breast.  Breasts without masses, retractions,  discharge or axillary adenopathy.  Red papules present on chest and breasts.          Mastodynia  Plan:  Diagnostic mammogram.  Begin annual screening mammography at age 38.      Follow-up with dermatology for treatment of rash as scheduled. OTC Vitamin E supplement.   Keep scheduled appointment for annual exam with Dr. Toney Rakes next week.

## 2014-06-22 NOTE — Patient Instructions (Signed)
Sensibilidad en las mamas °(Breast Tenderness) °La sensibilidad en las mamas es un problema frecuente en las mujeres de todas las edades. y puede causar molestias leves o dolor intenso. Sus causas son variadas. Su médico determinará la causa probable de la sensibilidad mediante el examen de las mamas, las preguntas sobre los síntomas y la indicación de algunos estudios. Por lo general, la sensibilidad en las mamas no significa que tenga cáncer de mama. °INSTRUCCIONES PARA EL CUIDADO EN EL HOGAR  °A menudo, la sensibilidad en las mamas puede tratarse en el hogar. Puede intentar lo siguiente: °· Probarse un nuevo sostén que le brinde más sujeción, especialmente mientras hace actividad física. °· Usar un sostén con mejor sujeción o uno deportivo mientras duerme cuando las mamas están muy sensibles. °· Si tiene una lesión mamaria, aplique hielo en la zona: °¨ Ponga el hielo en una bolsa plástica. °¨ Colóquese una toalla entre la piel y la bolsa de hielo. °¨ Deje el hielo durante 20 minutos y aplíquelo 2 a 3 veces por día. °· Si tiene las mamas repletas de leche debido a la lactancia, intente lo siguiente: °¨ Extráigase leche manualmente o con un sacaleche. °¨ Aplíquese una compresa tibia en las mamas para ayudar a la descarga. °· Tome analgésicos de venta libre si su médico lo autoriza. °· Tome otros medicamentos que su médico le recete, entre ellos, antibióticos o anticonceptivos. °A largo plazo, puede aliviar la sensibilidad en las mamas si hace lo siguiente: °· Disminuye el consumo de cafeína. °· Disminuye la cantidad de grasa de la dieta. °Lleva un registro de los días y las horas cuando tiene mayor sensibilidad en las mamas. Esto será de ayuda para que usted y su médico encuentren la causa de la sensibilidad y cómo aliviarla. Además, aprenda cómo examinarse las mamas en casa. Esto la ayudará a palpar un crecimiento o un bulto fuera de lo normal que podría causar la sensibilidad. °SOLICITE ATENCIÓN MÉDICA SI:   °· Cualquier zona de la mama está dura, enrojecida y caliente al tacto. Puede ser un signo de infección. °· Hay secreción de los pezones (y no está amamantando). En especial, vigile la secreción de sangre o pus. °· Tiene fiebre, además de sensibilidad en las mamas. °· Tiene un bulto nuevo o doloroso en la mama que no desaparece después de la finalización del período menstrual. °· Ha intentando controlar el dolor en casa, pero no desaparece. °· El dolor de la mama es más intenso o le dificulta hacer las cosas que hace habitualmente durante el día. °Document Released: 07/15/2013 °ExitCare® Patient Information ©2015 ExitCare, LLC. This information is not intended to replace advice given to you by your health care provider. Make sure you discuss any questions you have with your health care provider. ° °

## 2014-06-28 ENCOUNTER — Other Ambulatory Visit: Payer: Self-pay | Admitting: Women's Health

## 2014-06-28 DIAGNOSIS — N644 Mastodynia: Secondary | ICD-10-CM

## 2014-06-30 ENCOUNTER — Encounter: Payer: Self-pay | Admitting: Gynecology

## 2014-06-30 ENCOUNTER — Other Ambulatory Visit (HOSPITAL_COMMUNITY)
Admission: RE | Admit: 2014-06-30 | Discharge: 2014-06-30 | Disposition: A | Discharge status: Home / Self Care | Payer: BC Managed Care – PPO | Source: Ambulatory Visit | Attending: Gynecology | Admitting: Gynecology

## 2014-06-30 ENCOUNTER — Ambulatory Visit (INDEPENDENT_AMBULATORY_CARE_PROVIDER_SITE_OTHER): Payer: BC Managed Care – PPO | Admitting: Gynecology

## 2014-06-30 VITALS — BP 126/82 | Ht 61.5 in | Wt 176.0 lb

## 2014-06-30 DIAGNOSIS — Z87412 Personal history of vulvar dysplasia: Secondary | ICD-10-CM

## 2014-06-30 DIAGNOSIS — Z23 Encounter for immunization: Secondary | ICD-10-CM

## 2014-06-30 DIAGNOSIS — E039 Hypothyroidism, unspecified: Secondary | ICD-10-CM

## 2014-06-30 DIAGNOSIS — Z01419 Encounter for gynecological examination (general) (routine) without abnormal findings: Secondary | ICD-10-CM | POA: Diagnosis present

## 2014-06-30 NOTE — Progress Notes (Signed)
Abigail Reid [birth year] [id number]   History:    38 y.o. presented to the office today for her annual exam. 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 Patient 2012 had a full STD screening which was negative. She has had history in the past of Chlamydia infection.  The patient was diagnosed with hypothyroidism and is currently on levothyroxine 50 mcg daily. Patient is currently on doxycycline and placed by her dermatologist secondary to neck and upper chest dermatitis. She scheduled to see him next 1-2 weeks.   Past medical history,surgical history, family history and social history were all reviewed and documented in the EPIC chart.  Gynecologic History Patient's last menstrual period was 03/28/2011. Contraception: status post hysterectomy Last Pap: 2014. Results were: normal Last mammogram: Not indicated. Results were: Not indicated  Obstetric History OB History  Gravida Para Term Preterm AB SAB TAB Ectopic Multiple Living  3 3 3       3     # Outcome Date GA Lbr Len/2nd Weight Sex Delivery Anes PTL Lv  3 TRM     F SVD  N Y  2 TRM     F SVD  N Y  1 TRM     F SVD  N Y       ROS: A ROS was performed and pertinent positives and negatives are included in the history.  GENERAL: No fevers or chills. HEENT: No change in vision, no earache, sore throat or sinus congestion. NECK: No pain or stiffness. CARDIOVASCULAR: No chest pain or pressure. No palpitations. PULMONARY: No shortness of breath, cough or wheeze. GASTROINTESTINAL: No abdominal pain, nausea, vomiting or diarrhea, melena or  bright red blood per rectum. GENITOURINARY: No urinary frequency, urgency, hesitancy or dysuria. MUSCULOSKELETAL: No joint or muscle pain, no back pain, no recent trauma. DERMATOLOGIC: No rash, no itching, no lesions. ENDOCRINE: No polyuria, polydipsia, no heat or cold intolerance. No recent change in weight. HEMATOLOGICAL: No anemia or easy bruising or bleeding. NEUROLOGIC: No headache, seizures, numbness, tingling or weakness. PSYCHIATRIC: No depression, no loss of interest in normal activity or change in sleep pattern.     Exam: chaperone present  BP 126/82  Ht 5' 1.5" (1.562 m)  Wt 176 lb (79.833 kg)  BMI 32.72 kg/m2  LMP 03/28/2011  Body mass index is 32.72 kg/(m^2).  General appearance : Well developed well nourished female. No acute distress HEENT: Neck supple, trachea midline, no carotid bruits, no thyroidmegaly Lungs: Clear to auscultation, no rhonchi or wheezes, or rib retractions  Heart: Regular rate and rhythm, no murmurs or gallops Breast:Examined in sitting and supine position were symmetrical in appearance, no palpable masses or tenderness,  no skin retraction, no nipple inversion, no nipple discharge, no skin discoloration, no axillary or supraclavicular lymphadenopathy Abdomen: no palpable masses or tenderness, no rebound or guarding Extremities: no edema or skin discoloration or tenderness  Pelvic:  Bartholin, Urethra, Skene Glands: Within normal limits             Vagina: No gross lesions or discharge  Cervix: Absent  Uterus absent  Adnexa: No palpable masses or tenderness  Anus and perineum  normal   Rectovaginal  normal sphincter tone without palpated masses or tenderness             Hemoccult not indicated     Assessment/Plan:  38 y.o. female for annual exam with past history of VI II treated with Aldara in the past no evidence of recurrence. I have asked the patient to return back in 3 months for a detailed colposcopic evaluation of her external  genitalia. Pap smear was done today. She will return back to the office in a fasting state for the following labs: Fasting lipid profile, comprehensive metabolic panel, CBC, TSH and urinalysis. She was reminded to do her monthly breast exam. Patient received the flu vaccine today.I   Note: This dictation was prepared with  Dragon/digital dictation along withSmart phrase technology. Any transcriptional errors that result from this process are unintentional.   Terrance Mass MD, 3:08 PM 06/30/2014

## 2014-06-30 NOTE — Addendum Note (Signed)
Addended by: Thurnell Garbe A on: 06/30/2014 03:47 PM   Modules accepted: Orders

## 2014-06-30 NOTE — Patient Instructions (Signed)
Influenza Virus Vaccine injection (Fluarix) Qu es este medicamento? La VACUNA ANTIGRIPAL ayuda a disminuir el riesgo de contraer la influenza, tambin conocida como la gripe. La vacuna solo ayuda a protegerle contra algunas cepas de influenza. Esta vacuna no ayuda a reducir Catering manager de contraer influenza pandmica H1N1. Este medicamento puede ser utilizado para otros usos; si tiene alguna pregunta consulte con su proveedor de atencin mdica o con su farmacutico. MARCAS COMERCIALES DISPONIBLES: Fluarix, Fluzone Qu le debo informar a mi profesional de la salud antes de tomar este medicamento? Necesita saber si usted presenta alguno de los siguientes problemas o situaciones: -trastorno de sangrado como hemofilia -fiebre o infeccin -sndrome de Guillain-Barre u otros problemas neurolgicos -problemas del sistema inmunolgico -infeccin por el virus de la inmunodeficiencia humana (VIH) o SIDA -niveles bajos de plaquetas en la sangre -esclerosis mltiple -una Risk analyst o inusual a las vacunas antigripales, a los huevos, protenas de pollo, al ltex, a la gentamicina, a otros medicamentos, alimentos, colorantes o conservantes -si est embarazada o buscando quedar embarazada -si est amamantando a un beb Cmo debo utilizar este medicamento? Esta vacuna se administra mediante inyeccin por va intramuscular. Lo administra un profesional de [employer]	[place]. Recibir una copia de informacin escrita sobre la vacuna antes de cada vacuna. Asegrese de leer este folleto cada vez cuidadosamente. Este folleto puede cambiar con frecuencia. Hable con su pediatra para informarse acerca del uso de este medicamento en nios. Puede requerir atencin especial. Sobredosis: Pngase en contacto inmediatamente con un centro toxicolgico o una sala de urgencia si usted cree que haya tomado demasiado medicamento. ATENCIN: [employer] es solo para usted. No comparta este medicamento con nadie. Qu sucede  si me olvido de una dosis? No se aplica en este caso. Qu puede interactuar con este medicamento? -quimioterapia o radioterapia -medicamentos que suprimen el sistema inmunolgico, tales como etanercept, anakinra, infliximab y adalimumab -medicamentos que tratan o previenen cogulos sanguneos, como warfarina -fenitona -medicamentos esteroideos, como la prednisona o la cortisona -teofilina -vacunas Puede ser que esta lista no menciona todas las posibles interacciones. Informe a su profesional de KB Home	[place] de [employer] productos a base de hierbas, medicamentos de [place] o suplementos nutritivos que est tomando. Si usted fuma, consume bebidas alcohlicas o si utiliza drogas ilegales, indqueselo tambin a su profesional de [employer]	[place]. Algunas sustancias pueden interactuar con su medicamento. A qu debo estar atento al usar [employer]? Informe a su mdico o a Barrister's clerk de la [employer] todos los efectos secundarios que persistan despus de 3 das. Llame a su proveedor de atencin mdica si se presentan sntomas inusuales dentro de las 6 semanas posteriores a la vacunacin. Es posible que todava pueda contraer la gripe, pero la enfermedad no ser tan fuerte como normalmente. No puede contraer la gripe de esta vacuna. La vacuna antigripal no le protege contra resfros u otras enfermedades que pueden causar [place]. Debe vacunarse cada ao. Qu efectos secundarios puedo tener al [employer] este medicamento? Efectos secundarios que debe informar a su mdico o a Barrister's clerk de la salud tan pronto como sea posible: -reacciones alrgicas como erupcin cutnea, picazn o urticarias, hinchazn de la cara, labios o lengua Efectos secundarios que, por lo general, no requieren atencin mdica (debe informarlos a su mdico o a su profesional de la salud si persisten o si son molestos): -fiebre -dolor de cabeza -molestias y dolores musculares -dolor, sensibilidad, enrojecimiento o Database administrator de la inyeccin -cansancio o debilidad Puede ser que [employer]  no menciona todos los posibles efectos secundarios. Comunquese a su mdico por asesoramiento mdico [employer] Inc. Usted puede informar los efectos secundarios a la FDA por telfono al 1-800-FDA-1088. Dnde debo guardar mi medicina? Esta vacuna se administra solamente en clnicas, farmacias, consultorio mdico u otro consultorio de un profesional de la salud y no Sports coach en su domicilio. ATENCIN: Este folleto es un resumen. Puede ser que no cubra toda la posible informacin. Si usted tiene preguntas acerca de esta medicina, consulte con su mdico, su farmacutico o su profesional de Technical sales engineer.  2015, Elsevier/Gold Standard. (2010-03-28 15:31:40)

## 2014-06-30 NOTE — Addendum Note (Signed)
Addended by: Thurnell Garbe A on: 06/30/2014 04:02 PM   Modules accepted: Orders

## 2014-07-02 LAB — CYTOLOGY - PAP

## 2014-07-05 ENCOUNTER — Other Ambulatory Visit: Payer: BC Managed Care – PPO

## 2014-07-05 DIAGNOSIS — Z01419 Encounter for gynecological examination (general) (routine) without abnormal findings: Secondary | ICD-10-CM

## 2014-07-05 LAB — COMPREHENSIVE METABOLIC PANEL
ALT: 10 U/L (ref 0–35)
AST: 18 U/L (ref 0–37)
Albumin: 4.1 g/dL (ref 3.5–5.2)
Alkaline Phosphatase: 67 U/L (ref 39–117)
BILIRUBIN TOTAL: 0.4 mg/dL (ref 0.2–1.2)
BUN: 10 mg/dL (ref 6–23)
CALCIUM: 9.4 mg/dL (ref 8.4–10.5)
CHLORIDE: 106 meq/L (ref 96–112)
CO2: 31 meq/L (ref 19–32)
CREATININE: 0.82 mg/dL (ref 0.50–1.10)
GLUCOSE: 100 mg/dL — AB (ref 70–99)
Potassium: 4.2 mEq/L (ref 3.5–5.3)
Sodium: 142 mEq/L (ref 135–145)
Total Protein: 7 g/dL (ref 6.0–8.3)

## 2014-07-05 LAB — LIPID PANEL
CHOL/HDL RATIO: 2.6 ratio
CHOLESTEROL: 151 mg/dL (ref 0–200)
HDL: 59 mg/dL (ref >39)
LDL Cholesterol: 81 mg/dL (ref 0–99)
TRIGLYCERIDES: 54 mg/dL (ref <150)
VLDL: 11 mg/dL (ref 0–40)

## 2014-07-05 LAB — CBC WITH DIFFERENTIAL/PLATELET
Basophils Absolute: 0 10*3/uL (ref 0.0–0.1)
Basophils Relative: 0 % (ref 0–1)
EOS ABS: 0.1 10*3/uL (ref 0.0–0.7)
EOS PCT: 2 % (ref 0–5)
HEMATOCRIT: 39.9 % (ref 36.0–46.0)
HEMOGLOBIN: 13.2 g/dL (ref 12.0–15.0)
LYMPHS ABS: 2.4 10*3/uL (ref 0.7–4.0)
Lymphocytes Relative: 33 % (ref 12–46)
MCH: 30.6 pg (ref 26.0–34.0)
MCHC: 33.1 g/dL (ref 30.0–36.0)
MCV: 92.6 fL (ref 78.0–100.0)
MONO ABS: 0.4 10*3/uL (ref 0.1–1.0)
MONOS PCT: 6 % (ref 3–12)
Neutro Abs: 4.2 10*3/uL (ref 1.7–7.7)
Neutrophils Relative %: 59 % (ref 43–77)
Platelets: 338 10*3/uL (ref 150–400)
RBC: 4.31 MIL/uL (ref 3.87–5.11)
RDW: 13.6 % (ref 11.5–15.5)
WBC: 7.2 10*3/uL (ref 4.0–10.5)

## 2014-07-05 LAB — TSH: TSH: 1.422 u[IU]/mL (ref 0.350–4.500)

## 2014-07-06 LAB — URINALYSIS W MICROSCOPIC + REFLEX CULTURE
BILIRUBIN URINE: NEGATIVE
Bacteria, UA: NONE SEEN
Casts: NONE SEEN
Crystals: NONE SEEN
Glucose, UA: NEGATIVE mg/dL
HGB URINE DIPSTICK: NEGATIVE
Ketones, ur: NEGATIVE mg/dL
Leukocytes, UA: NEGATIVE
Nitrite: NEGATIVE
PH: 8 (ref 5.0–8.0)
PROTEIN: NEGATIVE mg/dL
Specific Gravity, Urine: 1.01 (ref 1.005–1.030)
Urobilinogen, UA: 0.2 mg/dL (ref 0.0–1.0)

## 2014-07-13 NOTE — Telephone Encounter (Signed)
Appointment 07/16/14 @ 3:30 pm at [facility], claudia informed pt.

## 2014-07-16 ENCOUNTER — Other Ambulatory Visit: Payer: BC Managed Care – PPO

## 2014-07-28 ENCOUNTER — Inpatient Hospital Stay: Admission: RE | Admit: 2014-07-28 | Payer: BC Managed Care – PPO | Source: Ambulatory Visit

## 2014-07-28 ENCOUNTER — Other Ambulatory Visit: Payer: BC Managed Care – PPO

## 2014-08-09 ENCOUNTER — Encounter: Payer: Self-pay | Admitting: Gynecology

## 2014-08-09 ENCOUNTER — Other Ambulatory Visit: Payer: BC Managed Care – PPO

## 2014-09-22 ENCOUNTER — Encounter: Payer: Self-pay | Admitting: Gynecology

## 2014-09-22 ENCOUNTER — Ambulatory Visit (INDEPENDENT_AMBULATORY_CARE_PROVIDER_SITE_OTHER): Payer: BC Managed Care – PPO | Admitting: Gynecology

## 2014-09-22 VITALS — BP 124/78

## 2014-09-22 DIAGNOSIS — D071 Carcinoma in situ of vulva: Secondary | ICD-10-CM

## 2014-09-22 NOTE — Patient Instructions (Signed)
Colposcopa (Colposcopy) La colposcopa es un procedimiento para examinar el cuello del tero y la vagina o la zona externa alrededor de la vagina, para buscar signos de enfermedad o anormalidades. Para realizar este procedimiento se utiliza un microscopio con luz, llamado colposcopio. Durante el procedimiento podrn tomarle muestras de tejido, en caso que el profesional encuentre clulas anormales. La colposcopa se indica si la mujer tiene:  Papanicolau anormal. El Papanicolaou es un examen mdico que se realiza para Chief of Staff las clulas que estn en la superficie del cuello uterino. Un resultado de Pap que podra indicar la presencia del virus del papil  Colposcopa, Cuidado posterior (Colposcopy, Care After) La colposcopa es un procedimiento en el que se utiliza una herramienta especial para magnificar la superficie del cuello del tero. Tambin es posible que se tome una muestra de tejido (biopsia). Esta muestra se observar para identificar la presencia de cncer cervical u otros problemas. Despus del procedimiento: Podr sentir algunos clicos. Recustese algunos minutos si se siente mareada. Podr tener un sangrado que debera detenerse luego de [place]. CUIDADOS EN EL HOGAR No tenga relaciones sexuales ni use tampones durante 2 o 3 das o segn le hayan indicado. Slo tome medicamentos como lo indique su mdico. Contine tomando las pastillas anticonceptivas de la forma habitual. Averige los [place] de su anlisis Pregunte cundo estarn [employer] del examen. Asegrese de [employer]. SOLICITE AYUDA DE INMEDIATO SI: Tiene un sangrado abundante o elimina cogulos. Su temperatura es de 102 F (38.9 C) o mayor. Margette Fast secrecin vaginal anormal. Tiene clicos que no se Alber[employer]. Siente mareos, vrtigo o pierde el conocimiento (se desmaya). ASEGRESE DE QUE:  Comprende estas instrucciones. Controlar su enfermedad. Solicitar  ayuda de inmediato si no mejora o si empeora. Document Released: 10/27/2010 Document Revised: 12/17/2011 [facility] Patient Information 2015 [place]. This information is not intended to replace advice given to you by your health care provider. Make sure you discuss any questions you have with your health care provider.  oma humano (VPH). El virus puede producir verrugas genitales y est relacionado con el desarrollo de cncer cervical.  Una lcera en el cuello del tero y el resultado del Pap fue normal.  Se han observado verrugas genitales en el cuello del tero o en la zona externa de la vagina.  Una madre que ha consumido dietilstilbestrol (DES) durante el Media planner.  Relaciones sexuales dolorosas.  Hemorragias vaginales, especialmente despus de Retail banker. INFORME A SU MDICO:  Cualquier alergia que tenga.  Todos los [employer] [place], incluyendo vitaminas, hierbas, gotas oftlmicas, cremas y medicamentos de venta libre.  Problemas previos que usted o los [employer] de su familia hayan tenido con el uso de anestsicos.  Enfermedades de [employer].  Cirugas previas.  Padecimientos mdicos. RIESGOS Y COMPLICACIONES En general, la colposcopa es un procedimiento seguro. Sin embargo, Games developer procedimiento, pueden surgir complicaciones. Las complicaciones posibles son:  Hemorragias.  Infeccin.  Lesiones que no se detectan. ANTES DEL PROCEDIMIENTO   Informe al mdico si tiene el perodo menstrual. En general, la colposcopa no se realiza Paediatric nurse.  Durante las 24 horas previas a la colposcopa no debe:  Patent attorney.  Usar tampones.  Aplicarse medicamentos, cremas o supositorios en la vagina.  Tener [employer]. PROCEDIMIENTO  Durante el procedimiento, estar acostada sobre la espalda con los pies en los soportes (estribos). Le colocarn el la vagina un instrumento metlico o plstico entibiado  espculo) para [place] y  permitir al profesional visualizar el cuello del tero. El colposcopio se coloca fuera de la vagina. Este instrumento se [place] para ampliar y examinar el cuello del tero, la vagina y la zona externa de la misma. Se aplica una pequea cantidad de solucin lquida en la zona a observar. Este lquido facilita la observacin de clulas anormales. El mdico utilizar instrumentos para aspirar la mucosidad y las clulas del canal del cuello del tero. Luego registrar la ubicacin de las reas anormales. Si le hacen una biopsia durante el procedimiento, le aplicarn un medicamento para adormecer la zona (anestsico local). Podr sentir Clinical cytogeneticist o clicos leves mientras le hacen la biopsia. Despus del procedimiento, las muestras de tejido Publishing copy durante la biopsia se enviarn al laboratorio para ser [employer]. DESPUS DEL PROCEDIMIENTO  Le darn instrucciones para que concurra al control con su mdico para recibir los [employer] Insurance. Es importante que cumpla con todas las visitas. Document Released: 09/24/2005 Document Revised: 05/27/2013 [facility] Patient Information 2015 [place]. This information is not intended to replace advice given to you by your health care provider. Make sure you discuss any questions you have with your health care provider.

## 2014-09-23 NOTE — Progress Notes (Signed)
   Patient presented to the office today for colposcopic evaluation. Patient was seen in the office for her annual gynecological exam in 06/30/2014. 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   Patient's recent Pap smear done at this office visit was normal. Patient was asymptomatic today.  Colposcopic evaluation: Patient underwent extensive colposcopic evaluation whereby the external genitalia, perineum and perirectal region were inspected and after application of acetic acid no lesions were seen. The speculum was introduced into the vagina. A systematic inspection of the vagina and did not demonstrate any lesions even after application of acetic acid. Patient with previous history of hysterectomy.  Assessment/plan: Patient with past history of VIN 3 of the perineum treated with Aldara cream in 2010 with no recurrence. Patient doing well. We'll continue to do annual Pap smears and annual colposcopy for close surveillance unless patient notices any lesions prior to that time interval. All instructions were provided in Spanish.

## 2014-10-15 ENCOUNTER — Other Ambulatory Visit: Payer: Self-pay | Admitting: Gynecology

## 2014-10-15 ENCOUNTER — Encounter: Payer: Self-pay | Admitting: Gynecology

## 2015-01-27 ENCOUNTER — Ambulatory Visit (INDEPENDENT_AMBULATORY_CARE_PROVIDER_SITE_OTHER): Payer: BLUE CROSS/BLUE SHIELD | Admitting: Physician Assistant

## 2015-01-27 VITALS — BP 122/78 | HR 97 | Temp 98.4°F | Resp 16 | Ht 62.75 in | Wt 177.0 lb

## 2015-01-27 DIAGNOSIS — J4521 Mild intermittent asthma with (acute) exacerbation: Secondary | ICD-10-CM

## 2015-01-27 DIAGNOSIS — J45901 Unspecified asthma with (acute) exacerbation: Secondary | ICD-10-CM | POA: Insufficient documentation

## 2015-01-27 MED ORDER — IPRATROPIUM BROMIDE 0.02 % IN SOLN
0.5000 mg | Freq: Once | RESPIRATORY_TRACT | Status: AC
  Administered 2015-01-27: 0.5 mg via RESPIRATORY_TRACT

## 2015-01-27 MED ORDER — ALBUTEROL SULFATE (2.5 MG/3ML) 0.083% IN NEBU
2.5000 mg | INHALATION_SOLUTION | Freq: Once | RESPIRATORY_TRACT | Status: AC
  Administered 2015-01-27: 2.5 mg via RESPIRATORY_TRACT

## 2015-01-27 MED ORDER — ALBUTEROL SULFATE HFA 108 (90 BASE) MCG/ACT IN AERS: 2.0000 | INHALATION_SPRAY | RESPIRATORY_TRACT | Status: DC | PRN

## 2015-01-27 MED ORDER — PREDNISONE 20 MG PO TABS: ORAL_TABLET | ORAL | Status: DC

## 2015-01-27 NOTE — Progress Notes (Signed)
Subjective:    Patient ID: Abigail Reid, female    DOB: [birth year], 39 y.o.   MRN: [id number]  HPI  This is a 39 year old female who is presenting with problems with her allergies x 2 weeks. Takes zyrtec every day for allergies. She has been coughing, had nasal congestion and sore throat. Cough is minimally productive. She thought she was getting better 1 week ago but then 3 days ago cough started worsening and started having problems with asthma. She has a hx of mild intermittent asthma but has not had problems in 5 years. She does not have an inhaler at home. Coughing a lot at night. Has sore throat. Denies fever or chills. She is not a smoker.  Review of Systems  Constitutional: Negative for fever and chills.  HENT: Positive for sore throat. Negative for congestion, ear pain and sinus pressure.   Eyes: Negative for redness.  Respiratory: Positive for cough, shortness of breath and wheezing.   Gastrointestinal: Negative for nausea and vomiting.  Skin: Negative for rash.  Allergic/Immunologic: Positive for environmental allergies.  Hematological: Negative for adenopathy.  Psychiatric/Behavioral: Positive for sleep disturbance.    Patient Active Problem List   Diagnosis Date Noted  . Melasma 06/24/2013  . Hypothyroidism 06/13/2012  . 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 Weight gain 06/05/2012   Prior to Admission medications   Medication Sig Start Date End Date Taking? Authorizing Provider  levothyroxine (SYNTHROID, LEVOTHROID) 50 MCG tablet take 1 tablet by mouth once daily 10/15/14  Yes Terrance Mass, MD   No Known Allergies  Patient's social and family history were reviewed.     Objective:   Physical Exam  Constitutional: She is oriented to person, place, and time. She appears well-developed and well-nourished. No distress.  HENT:  Head: Normocephalic and atraumatic.  Right Ear: Hearing, tympanic membrane, external ear and ear canal normal.    Left Ear: Hearing, tympanic membrane, external ear and ear canal normal.  Nose: Nose normal. Right sinus exhibits no maxillary sinus tenderness and no frontal sinus tenderness. Left sinus exhibits no maxillary sinus tenderness and no frontal sinus tenderness.  Mouth/Throat: Uvula is midline and mucous membranes are normal. Posterior oropharyngeal erythema present. No oropharyngeal exudate or posterior oropharyngeal edema.  Eyes: Conjunctivae and lids are normal. Right eye exhibits no discharge. Left eye exhibits no discharge. No scleral icterus.  Cardiovascular: Normal rate, regular rhythm, normal heart sounds and normal pulses.   No murmur heard. Pulmonary/Chest: Effort normal. No respiratory distress. She has wheezes (diffuse). She has rhonchi (few, lower bases). She has no rales.  Wheezes reduced after 1 duoneb treatment but not completely resolved. Wheezes resolved after 2nd duoneb treatment.   Musculoskeletal: Normal range of motion.  Neurological: She is alert and oriented to person, place, and time.  Skin: Skin is warm, dry and intact. No lesion and no rash noted.  Psychiatric: She has a normal mood and affect. Her speech is normal and behavior is normal. Thought content normal.   BP 122/78 mmHg  Pulse 97  Temp(Src) 98.4 F (36.9 C) (Oral)  Resp 16  Ht 5' 2.75" (1.594 m)  Wt 177 lb (80.287 kg)  BMI 31.60 kg/m2  SpO2 96%  LMP 03/28/2011     Assessment & Plan:  1. Asthma with acute exacerbation, mild intermittent 2 duoneb treatments resolved symptoms and wheezing. She will take prednisone for acute exacerbation. Albuterol prn wheezing/SOB. She will continue zyrtec daily for allergies. If symptoms do  not improve in 7-10 days she will return. Advised if she continues to have problems with asthma she should return for better management of her symptoms.  - albuterol (PROVENTIL) (2.5 MG/3ML) 0.083% nebulizer solution 2.5 mg; Take 3 mLs (2.5 mg total) by nebulization once. -  ipratropium (ATROVENT) nebulizer solution 0.5 mg; Take 2.5 mLs (0.5 mg total) by nebulization once. - albuterol (PROVENTIL) (2.5 MG/3ML) 0.083% nebulizer solution 2.5 mg; Take 3 mLs (2.5 mg total) by nebulization once. - ipratropium (ATROVENT) nebulizer solution 0.5 mg; Take 2.5 mLs (0.5 mg total) by nebulization once. - albuterol (PROVENTIL HFA;VENTOLIN HFA) 108 (90 BASE) MCG/ACT inhaler; Inhale 2 puffs into the lungs every 4 (four) hours as needed for wheezing or shortness of breath (cough, shortness of breath or wheezing.).  Dispense: 1 Inhaler; Refill: 1 - predniSONE (DELTASONE) 20 MG tablet; Take 3 PO QAM x3days, 2 PO QAM x3days, 1 PO QAM x3days  Dispense: 18 tablet; Refill: 0   Benjaman Pott. Drenda Freeze, MHS [facility]  01/27/2015

## 2015-01-27 NOTE — Patient Instructions (Signed)
Continue taking zyrtec daily. Start taking prednisone as directed until finished. This medication can make you gain water weight but will get better once you stop. It can also make you feel jittery so make sure you take in the morning so doesn't make you have problems sleeping. Use albuterol inhaler every 4 hours as needed for wheezing, shortness of breath and cough. Return if not getting better in 7-10 days. If you start having more problems with your asthma, come back - you may need to be on an inhaler every day.

## 2015-05-20 ENCOUNTER — Telehealth: Payer: Self-pay | Admitting: *Deleted

## 2015-05-20 NOTE — Telephone Encounter (Signed)
-----   Message from Sinclair Grooms sent at 05/20/2015 11:03 AM EDT ----- Regarding: RX Patient needs refill on her thyroid medicine but she lost her prescribtion can you please call it to Pharmacy:  [facility], [place]

## 2015-05-20 NOTE — Telephone Encounter (Signed)
Patient has refills at pharmacy, if she lost Rx she will need to relay this to pharmacy and pay for Rx out of pocket.

## 2015-07-06 ENCOUNTER — Encounter: Payer: BLUE CROSS/BLUE SHIELD | Admitting: Gynecology

## 2015-07-11 ENCOUNTER — Ambulatory Visit (INDEPENDENT_AMBULATORY_CARE_PROVIDER_SITE_OTHER): Payer: BLUE CROSS/BLUE SHIELD | Admitting: Gynecology

## 2015-07-11 ENCOUNTER — Other Ambulatory Visit (HOSPITAL_COMMUNITY)
Admission: RE | Admit: 2015-07-11 | Discharge: 2015-07-11 | Disposition: A | Discharge status: Home / Self Care | Payer: Self-pay | Source: Ambulatory Visit | Attending: Gynecology | Admitting: Gynecology

## 2015-07-11 ENCOUNTER — Encounter: Payer: Self-pay | Admitting: Gynecology

## 2015-07-11 VITALS — BP 126/82 | Ht 61.75 in | Wt 163.0 lb

## 2015-07-11 DIAGNOSIS — Z01419 Encounter for gynecological examination (general) (routine) without abnormal findings: Secondary | ICD-10-CM | POA: Diagnosis not present

## 2015-07-11 DIAGNOSIS — D071 Carcinoma in situ of vulva: Secondary | ICD-10-CM | POA: Diagnosis not present

## 2015-07-11 DIAGNOSIS — Z1151 Encounter for screening for human papillomavirus (HPV): Secondary | ICD-10-CM | POA: Insufficient documentation

## 2015-07-11 LAB — URINALYSIS W MICROSCOPIC + REFLEX CULTURE
BACTERIA UA: NONE SEEN [HPF]
Bilirubin Urine: NEGATIVE
CASTS: NONE SEEN [LPF]
CRYSTALS: NONE SEEN [HPF]
Glucose, UA: NEGATIVE
Ketones, ur: NEGATIVE
Leukocytes, UA: NEGATIVE
Nitrite: NEGATIVE
PROTEIN: NEGATIVE
SPECIFIC GRAVITY, URINE: 1.006 (ref 1.001–1.035)
WBC UA: NONE SEEN WBC/HPF (ref <=5)
Yeast: NONE SEEN [HPF]
pH: 7 (ref 5.0–8.0)

## 2015-07-11 LAB — COMPREHENSIVE METABOLIC PANEL
ALK PHOS: 78 U/L (ref 33–115)
ALT: 11 U/L (ref 6–29)
AST: 18 U/L (ref 10–30)
Albumin: 4.1 g/dL (ref 3.6–5.1)
BILIRUBIN TOTAL: 0.4 mg/dL (ref 0.2–1.2)
BUN: 13 mg/dL (ref 7–25)
CO2: 28 mmol/L (ref 20–31)
Calcium: 9.2 mg/dL (ref 8.6–10.2)
Chloride: 104 mmol/L (ref 98–110)
Creat: 0.69 mg/dL (ref 0.50–1.10)
GLUCOSE: 92 mg/dL (ref 65–99)
Potassium: 4 mmol/L (ref 3.5–5.3)
Sodium: 139 mmol/L (ref 135–146)
TOTAL PROTEIN: 7.2 g/dL (ref 6.1–8.1)

## 2015-07-11 LAB — CBC WITH DIFFERENTIAL/PLATELET
Basophils Absolute: 0 10*3/uL (ref 0.0–0.1)
Basophils Relative: 0 % (ref 0–1)
EOS PCT: 2 % (ref 0–5)
Eosinophils Absolute: 0.2 10*3/uL (ref 0.0–0.7)
HCT: 40.4 % (ref 36.0–46.0)
Hemoglobin: 13.4 g/dL (ref 12.0–15.0)
LYMPHS PCT: 28 % (ref 12–46)
Lymphs Abs: 2.4 10*3/uL (ref 0.7–4.0)
MCH: 30.5 pg (ref 26.0–34.0)
MCHC: 33.2 g/dL (ref 30.0–36.0)
MCV: 91.8 fL (ref 78.0–100.0)
MPV: 9 fL (ref 8.6–12.4)
Monocytes Absolute: 0.3 10*3/uL (ref 0.1–1.0)
Monocytes Relative: 4 % (ref 3–12)
NEUTROS PCT: 66 % (ref 43–77)
Neutro Abs: 5.7 10*3/uL (ref 1.7–7.7)
PLATELETS: 383 10*3/uL (ref 150–400)
RBC: 4.4 MIL/uL (ref 3.87–5.11)
RDW: 13.9 % (ref 11.5–15.5)
WBC: 8.6 10*3/uL (ref 4.0–10.5)

## 2015-07-11 LAB — LIPID PANEL
Cholesterol: 188 mg/dL (ref 125–200)
HDL: 62 mg/dL (ref >=46)
LDL Cholesterol: 114 mg/dL (ref <130)
Total CHOL/HDL Ratio: 3 Ratio (ref <=5.0)
Triglycerides: 59 mg/dL (ref <150)
VLDL: 12 mg/dL (ref <30)

## 2015-07-11 LAB — TSH: TSH: 1.454 u[IU]/mL (ref 0.350–4.500)

## 2015-07-11 NOTE — Progress Notes (Signed)
Abigail Reid 1976-06-18 [id number]   History:    39 y.o.  for annual gyn exam with no complaints today. 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 Patient's Pap smear 2015 was normal and her colposcopic evaluation was otherwise unremarkable.  Patient's eating healthy and exercising she lost 13 pounds from last year. She has history of hypothyroidism currently on Synthroid and doing well. She has not had her baseline mammogram as of yet. Patient states she will get her flu vaccine at work in the next few weeks.  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 Patient many years ago had history of chlamydia infection.   Past medical history,surgical history, family history and social history were all reviewed and documented in the EPIC chart.  Gynecologic History Patient's last menstrual period was 03/28/2011. Contraception: status post hysterectomy Last Pap: 2015. Results were: normal Last mammogram: No prior study. Results were: No prior study  Obstetric History OB History  Gravida Para Term Preterm AB SAB TAB Ectopic Multiple Living  3 3 3       3     # Outcome Date GA Lbr Len/2nd Weight Sex Delivery Anes PTL Lv  3 Term     F Vag-Spont  N Y  2 Term     F Vag-Spont  N Y  1 Term     F Vag-Spont  N Y       ROS: A ROS was performed and pertinent positives and negatives are included in the history.  GENERAL: No fevers or chills. HEENT: No change in vision, no earache, sore throat or sinus congestion. NECK: No pain or stiffness. CARDIOVASCULAR: No chest pain or  pressure. No palpitations. PULMONARY: No shortness of breath, cough or wheeze. GASTROINTESTINAL: No abdominal pain, nausea, vomiting or diarrhea, melena or bright red blood per rectum. GENITOURINARY: No urinary frequency, urgency, hesitancy or dysuria. MUSCULOSKELETAL: No joint or muscle pain, no back pain, no recent trauma. DERMATOLOGIC: No rash, no itching, no lesions. ENDOCRINE: No polyuria, polydipsia, no heat or cold intolerance. No recent change in weight. HEMATOLOGICAL: No anemia or easy bruising or bleeding. NEUROLOGIC: No headache, seizures, numbness, tingling or weakness. PSYCHIATRIC: No depression, no loss of interest in normal activity or change in sleep pattern.     Exam: chaperone present  BP 126/82 mmHg  Ht 5' 1.75" (1.568 m)  Wt 163 lb (73.936 kg)  BMI 30.07 kg/m2  LMP 03/28/2011  Body mass index is 30.07 kg/(m^2).  General appearance : Well developed well nourished female. No acute distress HEENT: Eyes: no retinal hemorrhage or exudates,  Neck supple, trachea midline, no carotid bruits, no thyroidmegaly Lungs: Clear to auscultation, no rhonchi or wheezes, or rib retractions  Heart: Regular rate and rhythm, no murmurs or gallops Breast:Examined in sitting and supine position were symmetrical in appearance, no palpable masses or tenderness,  no skin retraction, no nipple inversion, no nipple discharge, no skin discoloration, no axillary or supraclavicular lymphadenopathy Abdomen: no palpable masses or tenderness, no rebound or guarding Extremities: no edema or skin discoloration or tenderness  Pelvic:  Bartholin, Urethra, Skene Glands: Within normal  limits             Vagina: No gross lesions or discharge  Cervix: Absent  Uterus  absent  Adnexa  Without masses or tenderness  Anus and perineum  normal   Rectovaginal  normal sphincter tone without palpated masses or tenderness             Hemoccult not indicated     Assessment/Plan:  39 y.o. female for annual exam with  past history of VIN 3 treated with Aldara several years ago with no evidence of recurrence. We are doing a complete colposcopic evaluation once a year and she will need to follow-up in 3 months. Her Pap smear was done today. Her screening blood work consisting of the following was ordered today: Comprehensive metabolic panel, fasting lipid profile, TSH, CBC, and urinalysis. Patient to have her flu vaccine at work. Requisition to schedule her baseline mammogram was provided.   Terrance Mass MD, 9:08 AM 07/11/2015

## 2015-07-12 ENCOUNTER — Other Ambulatory Visit: Payer: Self-pay | Admitting: Gynecology

## 2015-07-12 DIAGNOSIS — R3129 Other microscopic hematuria: Secondary | ICD-10-CM

## 2015-07-12 LAB — CYTOLOGY - PAP

## 2015-07-13 ENCOUNTER — Other Ambulatory Visit: Payer: BLUE CROSS/BLUE SHIELD

## 2015-07-13 DIAGNOSIS — R3129 Other microscopic hematuria: Secondary | ICD-10-CM

## 2015-07-13 LAB — URINE CULTURE: Colony Count: 85000

## 2015-07-14 LAB — URINALYSIS W MICROSCOPIC + REFLEX CULTURE
BACTERIA UA: NONE SEEN [HPF]
BILIRUBIN URINE: NEGATIVE
CASTS: NONE SEEN [LPF]
CRYSTALS: NONE SEEN [HPF]
Glucose, UA: NEGATIVE
Hgb urine dipstick: NEGATIVE
Ketones, ur: NEGATIVE
Leukocytes, UA: NEGATIVE
Nitrite: NEGATIVE
PROTEIN: NEGATIVE
SPECIFIC GRAVITY, URINE: 1.007 (ref 1.001–1.035)
Squamous Epithelial / LPF: NONE SEEN [HPF] (ref <=5)
WBC, UA: NONE SEEN WBC/HPF (ref <=5)
YEAST: NONE SEEN [HPF]
pH: 7.5 (ref 5.0–8.0)

## 2015-07-15 LAB — URINE CULTURE

## 2015-09-08 ENCOUNTER — Other Ambulatory Visit: Payer: Self-pay

## 2015-09-08 DIAGNOSIS — Z1231 Encounter for screening mammogram for malignant neoplasm of breast: Secondary | ICD-10-CM

## 2015-09-14 ENCOUNTER — Ambulatory Visit (INDEPENDENT_AMBULATORY_CARE_PROVIDER_SITE_OTHER): Payer: BLUE CROSS/BLUE SHIELD | Admitting: Gynecology

## 2015-09-14 ENCOUNTER — Encounter: Payer: Self-pay | Admitting: Gynecology

## 2015-09-14 VITALS — BP 126/80

## 2015-09-14 DIAGNOSIS — D071 Carcinoma in situ of vulva: Secondary | ICD-10-CM | POA: Diagnosis not present

## 2015-09-14 NOTE — Progress Notes (Signed)
    patient presented to the office for her annual colposcopic evaluation of her external genitalia due to the fact several years ago she had VIN III. 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 Patient's Pap smear 2015 was normal and her colposcopic evaluation was otherwise unremarkable.  Her Pap smears in 2012, 2013, 2014, 2015 and 2016 were normal  Her recent screening blood work consisting of CBC, comprehensive metabolic panel, fasting lipid profile, TSH and urinalysis were all normal.  Patient underwent a detail colposcopic evaluation to include the external genitalia, perineum and perirectal region with no lesions seen. Acetic acid was then applied no lesions were noted. A speculum was introduced into the vagina. A systematic inspection of the vagina did not demonstrate any lesions patient with prior history of hysterectomy. Acetic acid was applied and no lesions were seen.  Assessment/plan: Patient with past history of VIN III treated with Aldara cream back in 2010 has done well with no recurrence. We will continue to do Pap smears once a year and thorough colposcopic evaluation.

## 2015-09-14 NOTE — Patient Instructions (Signed)
Colposcopía  (Colposcopy)  La colposcopía es un procedimiento para examinar el cuello del útero y la vagina o la zona externa alrededor de la vagina, para buscar signos de enfermedad o anormalidades. Para realizar este procedimiento se utiliza un microscopio con luz, llamado colposcopio. Durante el procedimiento podrán tomarle muestras de tejido, en caso que el profesional encuentre células anormales. La colposcopía se indica si la mujer tiene:  · Papanicolau anormal. El Papanicolaou es un examen médico que se realiza para evaluar las células que están en la superficie del cuello uterino.  · Un resultado de Pap que podría indicar la presencia del virus del papiloma humano (VPH). El virus puede producir verrugas genitales y está relacionado con el desarrollo de cáncer cervical.  · Una úlcera en el cuello del útero y el resultado del Pap fue normal.  · Se han observado verrugas genitales en el cuello del útero o en la zona externa de la vagina.  · Una madre que ha consumido dietilstilbestrol (DES) durante el embarazo.  · Relaciones sexuales dolorosas.  · Hemorragias vaginales, especialmente después de mantener relaciones sexuales.  INFORME A SU MÉDICO:  · Cualquier alergia que tenga.  · Todos los medicamentos que utiliza, incluyendo vitaminas, hierbas, gotas oftálmicas, cremas y medicamentos de venta libre.  · Problemas previos que usted o los miembros de su familia hayan tenido con el uso de anestésicos.  · Enfermedades de la sangre.  · Cirugías previas.  · Padecimientos médicos.  RIESGOS Y COMPLICACIONES  En general, la colposcopía es un procedimiento seguro. Sin embargo, como en cualquier procedimiento, pueden surgir complicaciones. Las complicaciones posibles son:  · Hemorragias.  · Infección.  · Lesiones que no se detectan.  ANTES DEL PROCEDIMIENTO   · Informe al médico si tiene el período menstrual. En general, la colposcopía no se realiza durante la menstruación.  · Durante las 24 horas previas a la colposcopía  no debe:    Realizar duchas vaginales.    Usar tampones.    Aplicarse medicamentos, cremas o supositorios en la vagina.    Tener relaciones sexuales.  PROCEDIMIENTO   Durante el procedimiento, estará acostada sobre la espalda con los pies en los soportes (estribos). Le colocarán el la vagina un instrumento metálico o plástico entibiado espéculo) para mantenerla abierta y permitir al profesional visualizar el cuello del útero. El colposcopio se coloca fuera de la vagina. Este instrumento se utiliza para ampliar y examinar el cuello del útero, la vagina y la zona externa de la misma. Se aplica una pequeña cantidad de solución líquida en la zona a observar. Este líquido facilita la observación de células anormales. El médico utilizará instrumentos para aspirar la mucosidad y las células del canal del cuello del útero. Luego registrará la ubicación de las áreas anormales.  Si le hacen una biopsia durante el procedimiento, le aplicarán un medicamento para adormecer la zona (anestésico local). Podrá sentir un dolor o cólicos leves mientras le hacen la biopsia. Después del procedimiento, las muestras de tejido recolectadas durante la biopsia se enviarán al laboratorio para ser analizadas.  DESPUÉS DEL PROCEDIMIENTO   Le darán instrucciones para que concurra al control con su médico para recibir los resultados de los estudios. Es importante que cumpla con todas las visitas.     Esta información no tiene como fin reemplazar el consejo del médico. Asegúrese de hacerle al médico cualquier pregunta que tenga.     Document Released: 09/24/2005 Document Revised: 05/27/2013  [employer]vier Interactive Patient Education ©2016 [employer]

## 2015-09-28 ENCOUNTER — Ambulatory Visit: Payer: Self-pay

## 2015-10-19 ENCOUNTER — Ambulatory Visit
Admission: RE | Admit: 2015-10-19 | Discharge: 2015-10-19 | Disposition: A | Discharge status: Home / Self Care | Payer: BLUE CROSS/BLUE SHIELD | Source: Ambulatory Visit

## 2015-10-19 DIAGNOSIS — Z1231 Encounter for screening mammogram for malignant neoplasm of breast: Secondary | ICD-10-CM

## 2015-10-20 ENCOUNTER — Other Ambulatory Visit: Payer: Self-pay | Admitting: Gynecology

## 2015-10-20 DIAGNOSIS — R928 Other abnormal and inconclusive findings on diagnostic imaging of breast: Secondary | ICD-10-CM

## 2015-10-25 ENCOUNTER — Ambulatory Visit
Admission: RE | Admit: 2015-10-25 | Discharge: 2015-10-25 | Disposition: A | Discharge status: Home / Self Care | Payer: BLUE CROSS/BLUE SHIELD | Source: Ambulatory Visit | Attending: Gynecology | Admitting: Gynecology

## 2015-10-25 DIAGNOSIS — R928 Other abnormal and inconclusive findings on diagnostic imaging of breast: Secondary | ICD-10-CM

## 2015-12-16 ENCOUNTER — Ambulatory Visit (INDEPENDENT_AMBULATORY_CARE_PROVIDER_SITE_OTHER): Payer: BLUE CROSS/BLUE SHIELD | Admitting: Family Medicine

## 2015-12-16 VITALS — BP 102/60 | HR 82 | Temp 98.1°F | Resp 16 | Ht 63.5 in | Wt 167.0 lb

## 2015-12-16 DIAGNOSIS — R059 Cough, unspecified: Secondary | ICD-10-CM

## 2015-12-16 DIAGNOSIS — R509 Fever, unspecified: Secondary | ICD-10-CM

## 2015-12-16 DIAGNOSIS — J4520 Mild intermittent asthma, uncomplicated: Secondary | ICD-10-CM | POA: Diagnosis not present

## 2015-12-16 DIAGNOSIS — R05 Cough: Secondary | ICD-10-CM | POA: Diagnosis not present

## 2015-12-16 LAB — POCT INFLUENZA A/B
INFLUENZA A, POC: NEGATIVE
Influenza B, POC: NEGATIVE

## 2015-12-16 MED ORDER — OSELTAMIVIR PHOSPHATE 75 MG PO CAPS: 75.0000 mg | ORAL_CAPSULE | Freq: Two times a day (BID) | ORAL | Status: DC

## 2015-12-16 NOTE — Progress Notes (Signed)
Subjective:  By signing my name below, I, Abigail Reid, attest that this documentation has been prepared under the direction and in the presence of Merri Ray, MD.  Electronically Signed: Thea Alken, ED Scribe. 12/16/2015. 10:39 AM.   Patient ID: Abigail Reid, female    DOB: 07/21/1976, 40 y.o.   MRN: [id number]  HPI Chief Complaint  Patient presents with  . Cough  . Nasal Congestion    one day pt states she has asthma    HPI Comments: Abigail Reid is a 40 y.o. female with hx of asthma who presents to the [facility] complaining of cough and nasal congestion.  Pt states symptoms started yesterday with fever and chills  during work. She reports symptoms worsened later that evening with cough, sore throat, congestion, temporal HA, body aches and back pain. She took ibuprofen yesterday. She states she had a cold a couple weeks ago and was treated at her company clinic. Pt has an albuterol inhaler but does not use it frequently. Pt works at the [employer] and report sick contacts at work. Pt received flu shot this year.  Pt is from [place].   Patient Active Problem List   Diagnosis Date Noted  . Asthma with acute exacerbation 01/27/2015  . Melasma 06/24/2013  . Hypothyroidism 06/13/2012  . 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 Weight gain 06/05/2012   Past Medical History  Diagnosis Date  . Asthma     no inhaler needed x 56yrs  . Vaginal delivery     GRAVIDA 3 PARA 3  . 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 STD (sexually transmitted disease) 02/21/11    POS CHLAMYDIA  . Fibroid uterus   . Hypothyroidism    Past Surgical History  Procedure Laterality Date  . Tubal ligation    . Laparoscopic tubal ligation    . Total abdominal hysterectomy  7.18.12    DR.FONTAINE ASSISTED  . Laparoscopic hysterectomy  04/25/2011    Attemted TLH/Converted to TAH  . Abdominal hysterectomy   04/25/2011    Procedure: HYSTERECTOMY ABDOMINAL;  Surgeon: Terrance Mass, MD;  Location: [facility];  Service: Gynecology;  Laterality: N/A;  Incision at 0913   No Known Allergies Prior to Admission medications   Medication Sig Start Date End Date Taking? Authorizing Provider  albuterol (PROVENTIL HFA;VENTOLIN HFA) 108 (90 BASE) MCG/ACT inhaler Inhale 2 puffs into the lungs every 4 (four) hours as needed for wheezing or shortness of breath (cough, shortness of breath or wheezing.). Patient not taking: Reported on 12/16/2015 01/27/15   Ezekiel Slocumb, PA-C  levothyroxine (SYNTHROID, LEVOTHROID) 50 MCG tablet take 1 tablet by mouth once daily Patient not taking: Reported on 12/16/2015 10/15/14   Terrance Mass, MD  predniSONE (DELTASONE) 20 MG tablet Take 3 PO QAM x3days, 2 PO QAM x3days, 1 PO QAM x3days Patient not taking: Reported on 07/11/2015 01/27/15   Tacy Dura   Social History   Social History  . Marital Status: Married    Spouse Name: N/A  . Number of Children: N/A  . Years of Education: N/A   Occupational History  . Not on file.   Social History Main Topics  . Smoking status: Never Smoker   . Smokeless tobacco: Never Used  . Alcohol Use: 0.0 oz/week    0 Standard drinks or equivalent per week     Comment: occasional  . Drug Use: No  .  Sexual Activity: Yes    Birth Control/ Protection: Surgical     Comment: TAH   Other Topics Concern  . Not on file   Social History Narrative   Review of Systems  Constitutional: Positive for fever and chills.  HENT: Positive for congestion and sore throat. Negative for sinus pressure.   Respiratory: Positive for cough. Negative for wheezing.   Musculoskeletal: Positive for myalgias and back pain.  Neurological: Positive for headaches.       Objective:   Physical Exam  Constitutional: She is oriented to person, place, and time. She appears well-developed and well-nourished. No distress.  HENT:  Head: Normocephalic and  atraumatic.  Right Ear: Hearing, tympanic membrane, external ear and ear canal normal.  Left Ear: Hearing, tympanic membrane, external ear and ear canal normal.  Nose: Nose normal. Right sinus exhibits no maxillary sinus tenderness and no frontal sinus tenderness. Left sinus exhibits no maxillary sinus tenderness and no frontal sinus tenderness.  Mouth/Throat: Oropharynx is clear and moist. No oropharyngeal exudate.  Eyes: Conjunctivae and EOM are normal. Pupils are equal, round, and reactive to light.  Neck: Neck supple.  Cardiovascular: Normal rate, regular rhythm, normal heart sounds and intact distal pulses.   No murmur heard. Pulmonary/Chest: Effort normal and breath sounds normal. No respiratory distress. She has no wheezes. She has no rhonchi. She has no rales.  Musculoskeletal: Normal range of motion.  Lymphadenopathy:    She has no cervical adenopathy.  Neurological: She is alert and oriented to person, place, and time.  Skin: Skin is warm and dry. No rash noted.  Psychiatric: She has a normal mood and affect. Her behavior is normal.  Nursing note and vitals reviewed.  Filed Vitals:   12/16/15 1034  BP: 102/60  Pulse: 82  Temp: 98.1 F (36.7 C)  Resp: 16  Height: 5' 3.5" (1.613 m)  Weight: 167 lb (75.751 kg)  SpO2: 98%    Results for orders placed or performed in visit on 12/16/15  POCT Influenza A/B  Result Value Ref Range   Influenza A, POC Negative Negative   Influenza B, POC Negative Negative   Assessment & Plan:   Abigail Reid is a 40 y.o. female Fever, unspecified - Plan: POCT Influenza A/B  Cough - Plan: POCT Influenza A/B  Asthma, mild intermittent, uncomplicated - Plan: oseltamivir (TAMIFLU) 75 MG capsule  Possible false negative flu test with typical symptoms. History of asthma, without acute exacerbation. With underlying asthma, decided to start Tamiflu for possible influenza with false-negative test. If cost prohibitive, or side effects, could stop  the medication. Other symptom care discussed with Mucinex, saline nasal spray. Stop Mucinex if causes an increase wheeze. If she is wheezing, has albuterol available, RTC precautions if persistent need. RTC precautions, and discussed in both [place] and Spanish with understanding expressed.  Meds ordered this encounter  Medications  . albuterol (PROVENTIL HFA;VENTOLIN HFA) 108 (90 Base) MCG/ACT inhaler    Sig: Inhale 1 puff into the lungs every 6 (six) hours as needed for wheezing or shortness of breath.  . oseltamivir (TAMIFLU) 75 MG capsule    Sig: Take 1 capsule (75 mg total) by mouth 2 (two) times daily.    Dispense:  10 capsule    Refill:  0   Patient Instructions  IF you received an x-ray today, you will receive an invoice from [facility]. Please contact [facility] at [phone] with questions or concerns regarding your invoice.   IF you received labwork today,  you will receive an invoice from [employer]. Please contact [facility] at [phone] with questions or concerns regarding your invoice.   Our billing staff will not be able to assist you with questions regarding bills from these companies.  You will be contacted with the lab results as soon as they are available. The fastest way to get your results is to activate your My Chart account. Instructions are located on the last page of this paperwork. If you have not heard from [place] regarding the results in 2 weeks, please contact this office.  Can start Tamiflu as discussed for possible flu, but your flu test was negative/normal here today.  Saline nasal spray at least 4 times per day if needed for nasal congestion, over the counter mucinex or mucinex DM as needed for cough (as long as it does not cause increased wheezing) tylenol or ibuprofen over the counter for fever and body aches, and drink plenty of fluids. Other information as in instructions below.  Return to the clinic or go to the  nearest emergency room if any of your symptoms worsen or new symptoms occur. If you do start to wheeze, continues her albuterol up to every 4-6 hours, but if frequent use needed or needing to use this more than 3 days in a row, recommend return to clinic for recheck.  Return to the clinic or go to the nearest emergency room if any of your symptoms worsen or new symptoms occur.    Infeccin del tracto respiratorio superior, adultos (Upper Respiratory Infection, Adult) La mayora de las infecciones del tracto respiratorio superior son infecciones virales de las vas que llevan el aire a los pulmones. Un infeccin del tracto respiratorio superior afecta la nariz, la garganta y las vas respiratorias superiores. El tipo ms frecuente de infeccin del tracto respiratorio superior es la nasofaringitis, que habitualmente se conoce como "resfro comn". Las infecciones del tracto respiratorio superior siguen su curso y por lo general se curan solas. En la [employer], la infeccin del tracto respiratorio superior no requiere atencin [place], Armed forces training and education officer a veces, despus de una infeccin viral, puede surgir una infeccin bacteriana en las vas respiratorias superiores. Esto se conoce como infeccin secundaria. Las infecciones sinusales y en el odo medio son tipos frecuentes de infecciones secundarias en el tracto respiratorio superior. La neumona bacteriana tambin puede complicar un cuadro de infeccin del tracto respiratorio superior. Este tipo de infeccin puede empeorar el asma y la enfermedad pulmonar obstructiva crnica (EPOC). En algunos casos, estas complicaciones pueden requerir atencin mdica de emergencia y poner en peligro la vida.  CAUSAS Casi todas las infecciones del tracto respiratorio superior se deben a los virus. Un virus es un tipo de microbio que puede contagiarse de Ardelia Mems persona a Theatre manager.  FACTORES DE RIESGO Puede estar en riesgo de sufrir una infeccin del tracto respiratorio superior  si:   Fuma.  Tiene una enfermedad pulmonar o cardaca crnica.  Tiene debilitado el sistema de defensa (inmunitario) del cuerpo.  Es muy joven o de edad muy [place].  Tiene asma o alergias nasales.  Trabaja en reas donde hay mucha gente o poca ventilacin.  Governor Rooks en una escuela o en un centro de atencin mdica. SIGNOS Y SNTOMAS  Habitualmente, los sntomas aparecen de 2a 3das despus de entrar en contacto con el virus del resfro. La mayora de las infecciones virales en el tracto respiratorio superior duran de 7a 10das. Sin embargo, las infecciones virales en el tracto respiratorio superior a causa  del virus de la gripe pueden durar de 14a 18das y, habitualmente, son ms graves. Entre los sntomas se pueden incluir los siguientes:   Secrecin o congestin nasal.  Estornudos.  Tos.  Dolor de Investment banker, operational.  Dolor de [place].  Fatiga.  Cristy Hilts.  Prdida del apetito.  Dolor en la frente, detrs de los ojos y por encima de los pmulos (dolor sinusal).  Dolores musculares. DIAGNSTICO  El mdico puede diagnosticar una infeccin del tracto respiratorio superior mediante los siguientes estudios:  Examen fsico.  Pruebas para verificar si los sntomas no se deben a otra afeccin, por ejemplo:  Faringitis estreptoccica.  Sinusitis.  Neumona.  Asma. TRATAMIENTO  Esta infeccin desaparece sola, con el tiempo. No puede curarse con medicamentos, pero a menudo se prescriben para aliviar los sntomas. Los medicamentos pueden ser tiles para lo siguiente:  Engineer, materials fiebre.  Reducir la tos.  Aliviar la congestin nasal. INSTRUCCIONES PARA EL CUIDADO EN EL HOGAR   Tome los medicamentos solamente como se lo haya indicado el mdico.  A fin de Best boy de garganta, haga grgaras con solucin salina templada o consuma caramelos para la tos, como se lo haya indicado el mdico.  Use un humidificador de vapor clido o inhale el vapor de la ducha para  aumentar la humedad del aire. Esto facilitar la respiracin.  Beba suficiente lquido para Consulting civil engineer orina clara o de color amarillo plido.  Consuma sopas y otros caldos transparentes, y [employer].  Descanse todo lo que sea necesario.  Regrese al Mat Carne cuando la temperatura se le haya normalizado o cuando el mdico lo autorice. Es posible que deba quedarse en su casa durante un tiempo prolongado, para no infectar a los dems. [place] usar un barbijo y lavarse las manos con cuidado para Mining engineer propagacin del virus.  Aumente el uso del inhalador si tiene asma.  No consuma ningn producto que contenga tabaco, lo que incluye cigarrillos, tabaco de Higher education careers adviser o Psychologist, sport and exercise. Si necesita ayuda para dejar de fumar, consulte al [employer]. PREVENCIN  La mejor manera de protegerse de un resfro es mantener una higiene [place].   Evite el contacto oral o fsico con personas que tengan sntomas de resfro.  En caso de contacto, lvese las manos con frecuencia. No hay pruebas claras de que la vitaminaC, la vitaminaE, la equincea o el ejercicio reduzcan la probabilidad de Museum/gallery curator un resfro. Sin embargo, siempre se recomienda Scientific laboratory technician, hacer ejercicio y Ecologist.  SOLICITE ATENCIN MDICA SI:   Su estado empeora en lugar de mejorar.  Los medicamentos no Animator.  Tiene escalofros.  La sensacin de falta de aire empeora.  Tiene mucosidad marrn o roja.  Tiene secrecin nasal amarilla o marrn.  Le duele la cara, especialmente al inclinarse hacia adelante.  Tiene fiebre.  Tiene los ganglios del cuello hinchados.  Siente dolor al tragar.  Tiene zonas blancas en la parte de atrs de la garganta. SOLICITE ATENCIN MDICA DE INMEDIATO SI:   Tiene sntomas intensos o persistentes de:  Dolor de [place].  Dolor de odos.  Dolor sinusal.  Dolor en el pecho.  Tiene enfermedad pulmonar crnica y cualquiera de estos  sntomas:  Sibilancias.  Tos prolongada.  Tos con sangre.  Cambio en la mucosidad habitual.  Presenta rigidez en el cuello.  Tiene cambios en:  La visin.  La audicin.  El pensamiento.  El [place] de nimo. ASEGRESE DE QUE:   Comprende estas instrucciones.  Controlar su afeccin.  Recibir  ayuda de inmediato si no mejora o si empeora.   Esta informacin no tiene Marine scientist el consejo del mdico. Asegrese de hacerle al mdico cualquier pregunta que tenga.   Document Released: 07/04/2005 Document Revised: 02/08/2015 Elsevier Interactive Patient Education 2016 [place] (Influenza, Adult) La gripe es una infeccin viral del tracto respiratorio. Ocurre con ms frecuencia en los meses de invierno, ya que las personas pasan ms tiempo en contacto cercano. La gripe puede enfermarlo considerablemente. Se transmite fcilmente de [place] persona a otra (es contagiosa). CAUSAS  La causa es un virus que infecta el tracto respiratorio. Puede contagiarse el virus al aspirar las gotitas que una persona infectada elimina al toser o Brewing technologist. Tambin puede contagiarse al tocar algo que fue recientemente contaminado con el virus y [employer] mano a la boca, la nariz o los ojos. RIESGOS Y COMPLICACIONES Tendr mayor riesgo de sufrir un resfro grave si consume cigarrillos, es diabtico, sufre una enfermedad cardaca (como insuficiencia cardaca) o pulmonar crnica (como asma) o si tiene debilitado el sistema inmunolgico. Los ancianos y las mujeres embarazadas tienen ms riesgo de sufrir infecciones graves. El problema ms frecuente de la gripe es la infeccin pulmonar (neumona). En algunos casos, este problema puede requerir atencin mdica de emergencia y Ship broker en peligro la vida. [place]  Los sntomas pueden durar entre 4 y 74 das y pueden ser:  Cristy Hilts.  Escalofros.  Dolor de [place], dolores en el cuerpo y musculares.  Dolor de  Investment banker, operational.  Molestias en el pecho y tos.  Prdida del apetito.  Debilidad o cansancio.  Mareos.  Nuseas o vmitos. DIAGNSTICO  El diagnstico se realiza segn su historia clnica y un examen fsico. Es necesario realizar un anlisis de cultivo farngeo o nasal para confirmar el diagnstico. TRATAMIENTO  En los casos leves, la gripe se cura sin Clinical research associate. El tratamiento est dirigido a Herbalist sntomas. En los casos ms graves, el mdico podr recetar medicamentos antivirales para acortar el curso de la enfermedad. Los antibiticos no son eficaces, ya que la infeccin est causada por un virus y no una bacteria. [place] los medicamentos solamente como se lo haya indicado el mdico.  Utilice un humidificador de niebla fra para facilitar la respiracin.  Haga reposo hasta que la temperatura vuelva a ser normal. Generalmente esto lleva entre 3 y 4 das.  Beba suficiente lquido para Consulting civil engineer orina clara o de color amarillo plido.  Cbrase la boca y la nariz al toser o Brewing technologist, y [employer] manos muy bien para evitar que se propague el virus.  Foy [place] en su casa y no concurra al Mat Carne o a la escuela hasta que la fiebre haya desaparecido al menos por un da completo. PREVENCIN  La vacunacin anual contra la gripe es la mejor manera de evitar enfermarse. Se recomienda ahora de manera rutinaria una vacuna anual contra la gripe a todos los [employer]. SOLICITE ATENCIN MDICA SI:  Tiene dolor en el pecho, la tos empeora o tiene ms mucosidad.  Tiene nuseas, vmitos o diarrea.  La fiebre regresa o empeora. SOLICITE ATENCIN MDICA DE INMEDIATO SI:   Tiene dificultad para respirar, le falta el [place] uas [place].  Presenta dolor intenso o entumecimiento en el cuello.  Le duele la cabeza de forma repentina o tiene dolor en la cara o el odo.  Tiene nuseas o vmitos que no puede  controlar.  ASEGRESE DE QUE:   Comprende estas instrucciones.  Controlar su afeccin.  Recibir ayuda de inmediato si no mejora o si empeora.   Esta informacin no tiene Marine scientist el consejo del mdico. Asegrese de hacerle al mdico cualquier pregunta que tenga.   Document Released: 07/04/2005 Document Revised: 10/15/2014 Elsevier Interactive Patient Education [employer].      I personally performed the services described in this documentation, which was scribed in my presence. The recorded information has been reviewed and considered, and addended by me as needed.

## 2015-12-16 NOTE — Patient Instructions (Addendum)
IF you received an x-ray today, you will receive an invoice from [facility]. Please contact [facility] at [phone] with questions or concerns regarding your invoice.   IF you received labwork today, you will receive an invoice from [employer]. Please contact [facility] at [phone] with questions or concerns regarding your invoice.   Our billing staff will not be able to assist you with questions regarding bills from these companies.  You will be contacted with the lab results as soon as they are available. The fastest way to get your results is to activate your My Chart account. Instructions are located on the last page of this paperwork. If you have not heard from [place] regarding the results in 2 weeks, please contact this office.  Can start Tamiflu as discussed for possible flu, but your flu test was negative/normal here today.  Saline nasal spray at least 4 times per day if needed for nasal congestion, over the counter mucinex or mucinex DM as needed for cough (as long as it does not cause increased wheezing) tylenol or ibuprofen over the counter for fever and body aches, and drink plenty of fluids. Other information as in instructions below.  Return to the clinic or go to the nearest emergency room if any of your symptoms worsen or new symptoms occur. If you do start to wheeze, continues her albuterol up to every 4-6 hours, but if frequent use needed or needing to use this more than 3 days in a row, recommend return to clinic for recheck.  Return to the clinic or go to the nearest emergency room if any of your symptoms worsen or new symptoms occur.    Infeccin del tracto respiratorio superior, adultos (Upper Respiratory Infection, Adult) La mayora de las infecciones del tracto respiratorio superior son infecciones virales de las vas que llevan el aire a los pulmones. Un infeccin del tracto respiratorio superior afecta la nariz, la garganta y  las vas respiratorias superiores. El tipo ms frecuente de infeccin del tracto respiratorio superior es la nasofaringitis, que habitualmente se conoce como "resfro comn". Las infecciones del tracto respiratorio superior siguen su curso y por lo general se curan solas. En la [employer], la infeccin del tracto respiratorio superior no requiere atencin [place], Armed forces training and education officer a veces, despus de una infeccin viral, puede surgir una infeccin bacteriana en las vas respiratorias superiores. Esto se conoce como infeccin secundaria. Las infecciones sinusales y en el odo medio son tipos frecuentes de infecciones secundarias en el tracto respiratorio superior. La neumona bacteriana tambin puede complicar un cuadro de infeccin del tracto respiratorio superior. Este tipo de infeccin puede empeorar el asma y la enfermedad pulmonar obstructiva crnica (EPOC). En algunos casos, estas complicaciones pueden requerir atencin mdica de emergencia y poner en peligro la vida.  CAUSAS Casi todas las infecciones del tracto respiratorio superior se deben a los virus. Un virus es un tipo de microbio que puede contagiarse de Ardelia Mems persona a Theatre manager.  FACTORES DE RIESGO Puede estar en riesgo de sufrir una infeccin del tracto respiratorio superior si:   Fuma.  Tiene una enfermedad pulmonar o cardaca crnica.  Tiene debilitado el sistema de defensa (inmunitario) del cuerpo.  Es muy joven o de edad muy [place] C Jones.  Tiene asma o alergias nasales.  Trabaja en reas donde hay mucha gente o poca ventilacin.  Governor Rooks en una escuela o en un centro de atencin mdica. SIGNOS Y SNTOMAS  Habitualmente, los sntomas aparecen de 2a 3das despus de entrar en  contacto con el virus del resfro. La mayora de las infecciones virales en el tracto respiratorio superior duran de 7a 10das. Sin embargo, las infecciones virales en el tracto respiratorio superior a causa del virus de la gripe pueden durar de 14a 18das y,  habitualmente, son ms graves. Entre los sntomas se pueden incluir los siguientes:   Secrecin o congestin nasal.  Estornudos.  Tos.  Dolor de Investment banker, operational.  Dolor de [place].  Fatiga.  Cristy Hilts.  Prdida del apetito.  Dolor en la frente, detrs de los ojos y por encima de los pmulos (dolor sinusal).  Dolores musculares. DIAGNSTICO  El mdico puede diagnosticar una infeccin del tracto respiratorio superior mediante los siguientes estudios:  Examen fsico.  Pruebas para verificar si los sntomas no se deben a otra afeccin, por ejemplo:  Faringitis estreptoccica.  Sinusitis.  Neumona.  Asma. TRATAMIENTO  Esta infeccin desaparece sola, con el tiempo. No puede curarse con medicamentos, pero a menudo se prescriben para aliviar los sntomas. Los medicamentos pueden ser tiles para lo siguiente:  Engineer, materials fiebre.  Reducir la tos.  Aliviar la congestin nasal. INSTRUCCIONES PARA EL CUIDADO EN EL HOGAR   Tome los medicamentos solamente como se lo haya indicado el mdico.  A fin de Best boy de garganta, haga grgaras con solucin salina templada o consuma caramelos para la tos, como se lo haya indicado el mdico.  Use un humidificador de vapor clido o inhale el vapor de la ducha para aumentar la humedad del aire. Esto facilitar la respiracin.  Beba suficiente lquido para Consulting civil engineer orina clara o de color amarillo plido.  Consuma sopas y otros caldos transparentes, y [employer].  Descanse todo lo que sea necesario.  Regrese al [place] cuando la temperatura se le haya normalizado o cuando el mdico lo autorice. Es posible que deba quedarse en su casa durante un tiempo prolongado, para no infectar a los dems. [place] usar un barbijo y lavarse las manos con cuidado para Mining engineer propagacin del virus.  Aumente el uso del inhalador si tiene asma.  No consuma ningn producto que contenga tabaco, lo que incluye cigarrillos, tabaco de Higher education careers adviser o  Psychologist, sport and exercise. Si necesita ayuda para dejar de fumar, consulte al [employer]. PREVENCIN  La mejor manera de protegerse de un resfro es mantener una higiene [place].   Evite el contacto oral o fsico con personas que tengan sntomas de resfro.  En caso de contacto, lvese las manos con frecuencia. No hay pruebas claras de que la vitaminaC, la vitaminaE, la equincea o el ejercicio reduzcan la probabilidad de Museum/gallery curator un resfro. Sin embargo, siempre se recomienda Scientific laboratory technician, hacer ejercicio y Ecologist.  SOLICITE ATENCIN MDICA SI:   Su estado empeora en lugar de mejorar.  Los medicamentos no Animator.  Tiene escalofros.  La sensacin de falta de aire empeora.  Tiene mucosidad marrn o roja.  Tiene secrecin nasal amarilla o marrn.  Le duele la cara, especialmente al inclinarse hacia adelante.  Tiene fiebre.  Tiene los ganglios del cuello hinchados.  Siente dolor al tragar.  Tiene zonas blancas en la parte de atrs de la garganta. SOLICITE ATENCIN MDICA DE INMEDIATO SI:   Tiene sntomas intensos o persistentes de:  Dolor de [place].  Dolor de odos.  Dolor sinusal.  Dolor en el pecho.  Tiene enfermedad pulmonar crnica y cualquiera de estos sntomas:  Sibilancias.  Tos prolongada.  Tos con sangre.  Cambio en la mucosidad habitual.  Presenta rigidez en el  cuello.  Tiene cambios en:  La visin.  La audicin.  El pensamiento.  El [place] de nimo. ASEGRESE DE QUE:   Comprende estas instrucciones.  Controlar su afeccin.  Recibir ayuda de inmediato si no mejora o si empeora.   Esta informacin no tiene Marine scientist el consejo del mdico. Asegrese de hacerle al mdico cualquier pregunta que tenga.   Document Released: 07/04/2005 Document Revised: 02/08/2015 Elsevier Interactive Patient Education 2016 [place] (Influenza, Adult) La gripe es una infeccin viral del  tracto respiratorio. Ocurre con ms frecuencia en los meses de invierno, ya que las personas pasan ms tiempo en contacto cercano. La gripe puede enfermarlo considerablemente. Se transmite fcilmente de [place] persona a otra (es contagiosa). CAUSAS  La causa es un virus que infecta el tracto respiratorio. Puede contagiarse el virus al aspirar las gotitas que una persona infectada elimina al toser o Brewing technologist. Tambin puede contagiarse al tocar algo que fue recientemente contaminado con el virus y [employer] mano a la boca, la nariz o los ojos. RIESGOS Y COMPLICACIONES Tendr mayor riesgo de sufrir un resfro grave si consume cigarrillos, es diabtico, sufre una enfermedad cardaca (como insuficiencia cardaca) o pulmonar crnica (como asma) o si tiene debilitado el sistema inmunolgico. Los ancianos y las mujeres embarazadas tienen ms riesgo de sufrir infecciones graves. El problema ms frecuente de la gripe es la infeccin pulmonar (neumona). En algunos casos, este problema puede requerir atencin mdica de emergencia y Ship broker en peligro la vida. [place]  Los sntomas pueden durar entre 4 y 87 das y pueden ser:  Cristy Hilts.  Escalofros.  Dolor de [place], dolores en el cuerpo y musculares.  Dolor de Investment banker, operational.  Molestias en el pecho y tos.  Prdida del apetito.  Debilidad o cansancio.  Mareos.  Nuseas o vmitos. DIAGNSTICO  El diagnstico se realiza segn su historia clnica y un examen fsico. Es necesario realizar un anlisis de cultivo farngeo o nasal para confirmar el diagnstico. TRATAMIENTO  En los casos leves, la gripe se cura sin Clinical research associate. El tratamiento est dirigido a Herbalist sntomas. En los casos ms graves, el mdico podr recetar medicamentos antivirales para acortar el curso de la enfermedad. Los antibiticos no son eficaces, ya que la infeccin est causada por un virus y no una bacteria. [place] los  medicamentos solamente como se lo haya indicado el mdico.  Utilice un humidificador de niebla fra para facilitar la respiracin.  Haga reposo hasta que la temperatura vuelva a ser normal. Generalmente esto lleva entre 3 y 4 das.  Beba suficiente lquido para Consulting civil engineer orina clara o de color amarillo plido.  Cbrase la boca y la nariz al toser o Brewing technologist, y [employer] manos muy bien para evitar que se propague el virus.  Foy [place] en su casa y no concurra al Mat Carne o a la escuela hasta que la fiebre haya desaparecido al menos por un da completo. PREVENCIN  La vacunacin anual contra la gripe es la mejor manera de evitar enfermarse. Se recomienda ahora de manera rutinaria una vacuna anual contra la gripe a todos los [employer]. SOLICITE ATENCIN MDICA SI:  Tiene dolor en el pecho, la tos empeora o tiene ms mucosidad.  Tiene nuseas, vmitos o diarrea.  La fiebre regresa o empeora. SOLICITE ATENCIN MDICA DE INMEDIATO SI:   Tiene dificultad para respirar, le falta el [place] uas [place].  Presenta  dolor intenso o entumecimiento en el cuello.  Le duele la cabeza de forma repentina o tiene dolor en la cara o el odo.  Tiene nuseas o vmitos que no puede controlar. ASEGRESE DE QUE:   Comprende estas instrucciones.  Controlar su afeccin.  Recibir ayuda de inmediato si no mejora o si empeora.   Esta informacin no tiene Marine scientist el consejo del mdico. Asegrese de hacerle al mdico cualquier pregunta que tenga.   Document Released: 07/04/2005 Document Revised: 10/15/2014 Elsevier Interactive Patient Education [employer].

## 2016-05-01 ENCOUNTER — Encounter: Payer: Self-pay | Admitting: Gynecology

## 2016-05-01 ENCOUNTER — Ambulatory Visit (INDEPENDENT_AMBULATORY_CARE_PROVIDER_SITE_OTHER): Payer: BLUE CROSS/BLUE SHIELD | Admitting: Gynecology

## 2016-05-01 VITALS — BP 114/76

## 2016-05-01 DIAGNOSIS — N9489 Other specified conditions associated with female genital organs and menstrual cycle: Secondary | ICD-10-CM | POA: Diagnosis not present

## 2016-05-01 DIAGNOSIS — R350 Frequency of micturition: Secondary | ICD-10-CM

## 2016-05-01 DIAGNOSIS — R140 Abdominal distension (gaseous): Secondary | ICD-10-CM

## 2016-05-01 DIAGNOSIS — N760 Acute vaginitis: Secondary | ICD-10-CM | POA: Diagnosis not present

## 2016-05-01 DIAGNOSIS — N898 Other specified noninflammatory disorders of vagina: Secondary | ICD-10-CM

## 2016-05-01 DIAGNOSIS — R1900 Intra-abdominal and pelvic swelling, mass and lump, unspecified site: Secondary | ICD-10-CM

## 2016-05-01 DIAGNOSIS — A499 Bacterial infection, unspecified: Secondary | ICD-10-CM

## 2016-05-01 DIAGNOSIS — R102 Pelvic and perineal pain: Secondary | ICD-10-CM | POA: Diagnosis not present

## 2016-05-01 DIAGNOSIS — B9689 Other specified bacterial agents as the cause of diseases classified elsewhere: Secondary | ICD-10-CM

## 2016-05-01 LAB — URINALYSIS W MICROSCOPIC + REFLEX CULTURE
Bilirubin Urine: NEGATIVE
CRYSTALS: NONE SEEN [HPF]
Casts: NONE SEEN [LPF]
GLUCOSE, UA: NEGATIVE
KETONES UR: NEGATIVE
LEUKOCYTES UA: NEGATIVE
Nitrite: NEGATIVE
PH: 6 (ref 5.0–8.0)
PROTEIN: NEGATIVE
Specific Gravity, Urine: <1.005 (ref 1.001–1.035)
WBC UA: NONE SEEN WBC/HPF (ref <=5)
Yeast: NONE SEEN [HPF]

## 2016-05-01 LAB — WET PREP FOR TRICH, YEAST, CLUE
Trich, Wet Prep: NONE SEEN
WBC WET PREP: NONE SEEN
YEAST WET PREP: NONE SEEN

## 2016-05-01 MED ORDER — CIPROFLOXACIN HCL 250 MG PO TABS: 250.0000 mg | ORAL_TABLET | Freq: Two times a day (BID) | ORAL | 0 refills | Status: DC

## 2016-05-01 NOTE — Progress Notes (Signed)
   HPI: Patient is a 40 year old that presented to the office today complaining of several days of left lower quadrant discomfort also urinary frequency but no dysuria. She also has had a vaginal odor. Patient isn't and amounts relationship. Patient denies any fever, chills, nausea, vomiting. No GI complaints. No change in appetite.   ROS: A ROS was performed and pertinent positives and negatives are included in the history.  GENERAL: No fevers or chills. HEENT: No change in vision, no earache, sore throat or sinus congestion. NECK: No pain or stiffness. CARDIOVASCULAR: No chest pain or pressure. No palpitations. PULMONARY: No shortness of breath, cough or wheeze. GASTROINTESTINAL: Left lower quadrant pains GENITOURINARY: Frequency in urination MUSCULOSKELETAL: No joint or muscle pain, no back pain, no recent trauma. DERMATOLOGIC: No rash, no itching, no lesions. ENDOCRINE: No polyuria, polydipsia, no heat or cold intolerance. No recent change in weight. HEMATOLOGICAL: No anemia or easy bruising or bleeding. NEUROLOGIC: No headache, seizures, numbness, tingling or weakness. PSYCHIATRIC: No depression, no loss of interest in normal activity or change in sleep pattern.   PE: Blood pressure 114/76 Gen. appearance well-developed well-nourished female with above mentioned complaint Neck: No CVA tenderness Pelvic: Bartholin urethra Skene was within normal limits Vagina: No gross lesions on inspection vaginal cuff intact patient with prior hysterectomy Bimanual exam for less than discomfort noted at the left lower quadrant Rectal exam not done  Urinalysis many bacteria, 10-20 RBC  Wet prep moderate clue cells too numerous to count bacteria    Assessment Plan: Patient with clinical evidence of bacterial vaginosis and/or urinary tract infection. She'll be placed on Cipro 250 mg one by mouth twice a day for 3 days to treat both. She will return back to the office in one-2 weeks for pelvic ultrasound for  better assessment of her left adnexa because of her discomfort and fullness term exam today.    Greater than 50% of time was spent in counseling and coordinating care of this patient.   Time of consultation: 15   Minutes.

## 2016-05-01 NOTE — Patient Instructions (Addendum)
Infeccin urinaria  (Urinary Tract Infection)  La infeccin urinaria puede ocurrir en Clinical cytogeneticist del tracto urinario. El tracto urinario es un sistema de drenaje del cuerpo por el que se eliminan los desechos y el exceso de [place]. El tracto urinario est formado por dos riones, dos urteres, la vejiga y Geologist, engineering. Los riones son rganos que tienen forma de frijol. Cada rin tiene aproximadamente el tamao del puo. Estn situados debajo de las [place], uno a cada lado de la columna vertebral CAUSAS  La causa de la infeccin son los microbios, que son organismos microscpicos, que incluyen hongos, virus, y bacterias. Estos organismos son tan pequeos que slo pueden verse a travs del microscopio. Las bacterias son los microorganismos que ms comnmente causan infecciones urinarias.  SNTOMAS  Los sntomas pueden variar segn la edad y el sexo del paciente y por la ubicacin de la infeccin. Los sntomas en las mujeres jvenes incluyen la necesidad frecuente e intensa de orinar y una sensacin dolorosa de ardor en la vejiga o en la uretra durante la miccin. Las mujeres y los hombres mayores podrn sentir cansancio, temblores y debilidad y Arts development officer musculares y Social research officer, government abdominal. Si tiene [place], puede significar que la infeccin est en los riones. Otros sntomas son dolor en la espalda o en los lados debajo de las [place], nuseas y vmitos.  DIAGNSTICO  Para diagnosticar una infeccin urinaria, el mdico le preguntar acerca de sus sntomas. [employer] una [place] de [place]. La muestra de orina se analiza para Hydrographic surveyor bacterias y glbulos blancos de Herbalist. Los glbulos blancos se forman en el organismo para ayudar a Radio broadcast assistant las infecciones.  TRATAMIENTO  Por lo general, las infecciones urinarias pueden tratarse con medicamentos. Debido a que la [employer] de las infecciones son causadas por bacterias, por lo general pueden tratarse con antibiticos. La eleccin del  antibitico y la duracin del tratamiento depender de sus sntomas y el tipo de bacteria causante de la infeccin.  INSTRUCCIONES PARA EL CUIDADO EN EL HOGAR   Si le recetaron antibiticos, tmelos exactamente como su mdico le indique. Termine el medicamento aunque se sienta mejor despus de haber tomado slo algunos.  Beba gran cantidad de lquido para mantener la orina de tono claro o color amarillo plido.  Evite la cafena, el t y las bebidas gaseosas. Estas sustancias irritan la vejiga.  Vaciar la vejiga con frecuencia. Evite retener la orina durante largos perodos.  Vace la vejiga antes y despus de Clinical biochemist.  Despus de mover el intestino, las mujeres deben higienizarse la regin perineal desde adelante hacia atrs. Use slo un papel tissue por vez. SOLICITE ATENCIN MDICA SI:   Siente dolor en la espalda.  Le sube la fiebre.  Los sntomas no mejoran luego de 3 das. SOLICITE ATENCIN MDICA DE INMEDIATO SI:   Siente dolor intenso en la espalda o en la zona inferior del abdomen.  Comienza a sentir escalofros.  Tiene nuseas o vmitos.  Tiene una sensacin continua de quemazn o molestias al [employer]. ASEGRESE DE QUE:   Comprende estas instrucciones.  Controlar su enfermedad.  Solicitar ayuda de inmediato si no mejora o empeora.   Esta informacin no tiene Marine scientist el consejo del mdico. Asegrese de hacerle al mdico cualquier pregunta que tenga.   Document Released: 07/04/2005 Document Revised: 06/18/2012 Elsevier Interactive Patient Education 2016 [place]. Ciprofloxacin extended-release tablets Qu es este medicamento? La CIPROFLOXACINA es un antibitico quinolnico. Se utiliza en el tratamiento de ciertos tipos de  infecciones bacterianas. No es efectivo para resfros, gripe u otras infecciones de origen viral. Este medicamento puede ser utilizado para otros usos; si tiene alguna pregunta consulte con su proveedor de  atencin mdica o con su farmacutico. Qu le debo informar a mi profesional de la salud antes de tomar este medicamento? Necesita saber si usted presenta alguno de los siguientes problemas o situaciones: -problemas de huesos -enfermedad cerebral -pulso cardaco irregular -problemas articulares -enfermedad renal -enfermedad heptica -miastenia gravis -trastorno de convulsiones -problemas de tendones -una reaccin alrgica o inusual a la ciprofloxacina, a otros antibiticos o medicamentos, alimentos, colorantes o conservantes -si est embarazada o buscando quedar embarazada -si est amamantando a un beb Cmo debo utilizar este medicamento? [place] este medicamento por va oral con un vaso lleno de agua. Siga las instrucciones de la etiqueta del [place]. No parta, triture ni mastique las tabletas. [place] sus dosis a intervalos regulares. No tome su medicamento con una frecuencia mayor a la indicada. [place] dosis de su medicamento como se le haya indicado aun si se siente mejor. No omita ninguna dosis o suspenda el uso de su medicamento antes de lo indicado. Este [employer] se puede tomar con las comidas o con el estmago vaco. No se debe tomar junto con productos lcteos, tales como [place], yogur o jugos fortificados con calcio, sin embargo se puede tomar con comidas que contienen productos lcteos. Su farmacutico le dar una [place] del medicamento especial con cada receta y relleno. Asegrese de leer esta informacin cada vez cuidadosamente. Hable con su pediatra para informarse acerca del uso de este medicamento en nios. Puede requerir atencin especial. Sobredosis: Pngase en contacto inmediatamente con un centro toxicolgico o una sala de urgencia si usted cree que haya tomado demasiado medicamento. ATENCIN: [employer] es solo para usted. No comparta este medicamento con nadie. Qu sucede si me olvido de una dosis? Si olvida una dosis, tmela lo antes posible. Si es casi  la hora de la prxima dosis, tome slo esa dosis. No tome dosis adicionales o dobles. Qu puede interactuar con este medicamento? No tome esta medicina con ninguno de los siguientes medicamentos: -cisapride -droperidol -terfenadina -tizanidina Esta medicina tambin puede interactuar con los siguientes medicamentos -anticidos -pldoras anticonceptivas -cafena -ciclosporina -polvo o tabletas tamponadas de didanosina (ddI) -medicamentos para la diabetes -medicamentos para la inflamacin, tales como ibuprofeno o naproxeno -metotrexato -multivitaminas -omeprazol -fenitona -probenecid -sucralfato -teofilina -warfarina Puede ser que esta lista no menciona todas las posibles interacciones. Informe a su profesional de [employer]	[place] de [employer] productos a base de hierbas, medicamentos de [place] o suplementos nutritivos que est tomando. Si usted fuma, consume bebidas alcohlicas o si utiliza drogas ilegales, indqueselo tambin a su profesional de [employer]	[place]. Algunas sustancias pueden interactuar con su medicamento. A qu debo estar atento al usar [employer]? Si los sntomas no mejoran, consulte con su mdico o con su profesional de [employer]	[place]. No trate la diarrea con productos de [employer]. Comunquese con su mdico si tiene diarrea que dura ms de 2 das o si es severa y [place]. Puede experimentar somnolencia o mareos. No conduzca ni utilice maquinaria, ni haga nada que Associate Professor en estado de alerta hasta que sepa cmo le afecta este medicamento. No se siente ni se ponga de pie con rapidez, especialmente si es un paciente de edad avanzada. Esto reduce el riesgo de mareos o [employer]. Este medicamento puede aumentar la sensibilidad al sol. Mantngase fuera de Administrator. Si no lo puede  evitar, utilice ropa protectora y crema de Photographer. No utilice lmparas solares, camas solares ni cabinas solares. Evite las preparaciones de anticidos, aluminio, calcio, hierro,  magnesio o cinc por lo menos 6 horas antes o 2 horas despus de Systems developer. Qu efectos secundarios puedo tener al [employer] este medicamento? Efectos secundarios que debe informar a su mdico o a Barrister's clerk de la salud tan pronto como sea posible: -Chief of Staff como erupcin cutnea, picazn o urticarias, hinchazn de la cara, labios o lengua -problemas respiratorios -confusin, pesadillas o alucinaciones -sensacin de desmayos o aturdimiento, cadas -pulso cardiaco irregular -hinchazn o dolores musculares, articulares o de tendones -dolor o dificultad para orinar -dolor de cabeza persistente con o sin visin borrosa -enrojecimiento, formacin de ampollas, descamacin o aflojamiento de la piel, inclusive dentro de la boca -convulsiones -dolor, entumecimiento, hormigueo o debilidad inusual Efectos secundarios que, por lo general, no requieren atencin mdica (debe informarlos a su mdico o a su profesional de la salud si persisten o si son molestos): -diarrea -nuseas o malestar estomacal -manchas blancas o llagas dentro de la boca Puede ser que esta lista no menciona todos los posibles efectos secundarios. Comunquese a su mdico por asesoramiento mdico [employer]. Usted puede informar los efectos secundarios a la FDA por telfono al 1-800-FDA-1088. Dnde debo guardar mi medicina? Mantngala fuera del alcance de los nios. [place] a [employer], entre 15 y 23 grados C (75 y 45 grados F). Mantenga el envase bien cerrado. Deseche los medicamentos que no haya utilizado, despus de la fecha de vencimiento. ATENCIN: Este folleto es un resumen. Puede ser que no cubra toda la posible informacin. Si usted tiene preguntas acerca de esta medicina, consulte con su mdico, su farmacutico o su profesional de Technical sales engineer.    2016, Elsevier/Gold Standard. (2014-11-16 00:00:00) Vaginosis bacteriana (Bacterial Vaginosis) La vaginosis bacteriana es  una infeccin vaginal que perturba el equilibrio normal de las bacterias que se encuentran en la vagina. Es el resultado de un crecimiento excesivo de ciertas bacterias. Esta es la infeccin vaginal ms frecuente en mujeres en edad reproductiva. El tratamiento es importante para prevenir complicaciones, especialmente en mujeres embarazadas, dado que puede causar un parto prematuro. CAUSAS  La vaginosis bacteriana se origina por un aumento de bacterias nocivas que, generalmente, estn presentes en cantidades ms pequeas en la vagina. Varios tipos diferentes de bacterias pueden causar esta afeccin. Sin embargo, la causa de su desarrollo no se comprende totalmente. [place] o comportamientos pueden exponerlo a un mayor riesgo de desarrollar vaginosis bacteriana, entre los que se incluyen:  Tener una nueva pareja sexual o mltiples parejas sexuales.  Las duchas vaginales  El uso del DIU (dispositivo intrauterino) como mtodo anticonceptivo. El contagio no se produce en baos, por ropas de cama, en piscinas o por contacto con objetos. SIGNOS Y SNTOMAS  Algunas mujeres que padecen vaginosis bacteriana no presentan signos ni sntomas. Los sntomas ms comunes son:  Secrecin vaginal de color grisceo.  Secrecin vaginal con olor similar al [employer], especialmente despus de Retail banker.  Picazn o sensacin de ardor en la vagina o la vulva.  Ardor o dolor al [employer]. DIAGNSTICO  Su mdico analizar su historia clnica y le examinar la vagina para detectar signos de vaginosis bacteriana. Puede tomarle [place]uddie Coco de flujo vaginal. Su mdico examinar esta muestra con un microscopio para controlar las bacterias y clulas anormales. Tambin puede realizarse un anlisis del pH vaginal.  TRATAMIENTO  La vaginosis bacteriana puede  tratarse con antibiticos, en forma de comprimidos o de crema vaginal. Puede indicarse una segunda tanda de antibiticos si la  afeccin se repite despus del tratamiento. Debido a que la vaginosis bacteriana aumenta el riesgo de contraer enfermedades de transmisin sexual, el tratamiento puede ayudar a reducir el riesgo de clamidia, [place], VIH y herpes. [place] solo medicamentos de venta libre o recetados, segn las indicaciones del mdico.  Si le han recetado antibiticos, tmelos como se le indic. Asegrese de que finaliza la prescripcin completa aunque se sienta mejor.  Comunique a sus compaeros sexuales que sufre una infeccin vaginal. Deben consultar a su mdico y recibir tratamiento si tienen problemas, como picazn o una erupcin cutnea leve.  Durante el [place], es importante que siga estas indicaciones:  Visual merchandiser relaciones sexuales o use preservativos de la forma correcta.  No se haga duchas vaginales.  Evite consumir alcohol como se lo haya indicado el mdico.  Community education officer se lo haya indicado el mdico. SOLICITE ATENCIN MDICA SI:   Sus sntomas no mejoran despus de 3 das de [place].  Aumenta la secrecin o Conservation officer, historic buildings.  Tiene fiebre. ASEGRESE DE QUE:   Comprende estas instrucciones.  Controlar su afeccin.  Recibir ayuda de inmediato si no mejora o si empeora. PARA OBTENER MS INFORMACIN  Centros para el control y la prevencin de Probation officer for Disease Control and Prevention, CDC): AppraiserFraud.fi Asociacin Estadounidense de la Salud Sexual (American Sexual Health Association, SHA): www.ashastd.org    Esta informacin no tiene Marine scientist el consejo del mdico. Asegrese de hacerle al mdico cualquier pregunta que tenga.   Document Released: 01/01/2008 Document Revised: 10/15/2014 Elsevier Interactive Patient Education [employer].

## 2016-05-03 LAB — URINE CULTURE: Organism ID, Bacteria: 10000

## 2016-05-10 ENCOUNTER — Other Ambulatory Visit: Payer: BLUE CROSS/BLUE SHIELD

## 2016-05-10 ENCOUNTER — Other Ambulatory Visit: Payer: Self-pay | Admitting: Anesthesiology

## 2016-05-10 ENCOUNTER — Other Ambulatory Visit: Payer: Self-pay | Admitting: Gynecology

## 2016-05-10 DIAGNOSIS — R319 Hematuria, unspecified: Secondary | ICD-10-CM

## 2016-05-11 LAB — URINALYSIS W MICROSCOPIC + REFLEX CULTURE
Bacteria, UA: NONE SEEN [HPF]
Bilirubin Urine: NEGATIVE
CASTS: NONE SEEN [LPF]
CRYSTALS: NONE SEEN [HPF]
Glucose, UA: NEGATIVE
Hgb urine dipstick: NEGATIVE
Ketones, ur: NEGATIVE
Leukocytes, UA: NEGATIVE
NITRITE: NEGATIVE
PH: 7 (ref 5.0–8.0)
Protein, ur: NEGATIVE
SPECIFIC GRAVITY, URINE: 1.008 (ref 1.001–1.035)
Squamous Epithelial / LPF: NONE SEEN [HPF] (ref <=5)
WBC, UA: NONE SEEN WBC/HPF (ref <=5)
Yeast: NONE SEEN [HPF]

## 2016-05-13 LAB — URINE CULTURE

## 2016-05-16 ENCOUNTER — Ambulatory Visit (INDEPENDENT_AMBULATORY_CARE_PROVIDER_SITE_OTHER): Payer: BLUE CROSS/BLUE SHIELD

## 2016-05-16 ENCOUNTER — Encounter: Payer: Self-pay | Admitting: Gynecology

## 2016-05-16 ENCOUNTER — Ambulatory Visit (INDEPENDENT_AMBULATORY_CARE_PROVIDER_SITE_OTHER): Payer: BLUE CROSS/BLUE SHIELD | Admitting: Gynecology

## 2016-05-16 VITALS — BP 122/80

## 2016-05-16 DIAGNOSIS — N9489 Other specified conditions associated with female genital organs and menstrual cycle: Secondary | ICD-10-CM | POA: Diagnosis not present

## 2016-05-16 DIAGNOSIS — R102 Pelvic and perineal pain: Secondary | ICD-10-CM

## 2016-05-16 DIAGNOSIS — R1032 Left lower quadrant pain: Secondary | ICD-10-CM | POA: Diagnosis not present

## 2016-05-16 DIAGNOSIS — R140 Abdominal distension (gaseous): Secondary | ICD-10-CM | POA: Diagnosis not present

## 2016-05-16 DIAGNOSIS — R1900 Intra-abdominal and pelvic swelling, mass and lump, unspecified site: Secondary | ICD-10-CM | POA: Diagnosis not present

## 2016-05-16 DIAGNOSIS — N898 Other specified noninflammatory disorders of vagina: Secondary | ICD-10-CM

## 2016-05-16 DIAGNOSIS — N7011 Chronic salpingitis: Secondary | ICD-10-CM

## 2016-05-16 MED ORDER — METRONIDAZOLE 500 MG PO TABS: 500.0000 mg | ORAL_TABLET | Freq: Two times a day (BID) | ORAL | 0 refills | Status: DC

## 2016-05-16 NOTE — Patient Instructions (Addendum)
Metronidazole tablets or capsules Qu es este medicamento? El METRONIDAZOL es un antiinfeccioso. Se utiliza en el tratamiento de ciertos tipos de infecciones bacterianas y por protozoos. No es efectivo para resfros, gripe u otras infecciones de origen viral. Este medicamento puede ser utilizado para otros usos; si tiene alguna pregunta consulte con su proveedor de atencin mdica o con su farmacutico. Qu le debo informar a mi profesional de la salud antes de tomar este medicamento? Necesita saber si usted presenta alguno de los siguientes problemas o situaciones: -anemia u otros trastornos sanguneos -enfermedad del sistema nervioso -infeccin mictica o por levadura -si consume bebidas alcohlicas -enfermedad heptica -convulsiones -una reaccin alrgica o inusual al metronidazol, a otros medicamentos, alimentos, colorantes o conservantes -si est embarazada o buscando quedar embarazada -si est amamantando a un beb Cmo debo utilizar este medicamento? Tome este medicamento por va oral con un vaso lleno de agua. Siga las instrucciones de la etiqueta del [place]. Tome sus dosis a intervalos regulares. No tome su medicamento con una frecuencia mayor a la indicada. Complete todo el tratamiento con el medicamento como recetado aun si se siente mejor. No omita ninguna dosis o suspenda el uso de su medicamento antes de lo indicado. Hable con su pediatra para informarse acerca del uso de este medicamento en nios. Puede requerir atencin especial. Sobredosis: Pngase en contacto inmediatamente con un centro toxicolgico o una sala de urgencia si usted cree que haya tomado demasiado medicamento. ATENCIN: [employer] es solo para usted. No comparta este medicamento con nadie. Qu sucede si me olvido de una dosis? Si olvida una dosis, tmela lo antes posible. Si es casi la hora de la prxima dosis, tome slo esa dosis. No tome dosis adicionales o dobles. Qu puede interactuar con  este medicamento? No tome esta medicina con ninguno de los siguientes medicamentos: -alcohol o cualquier producto que contiene alcohol -solucin oral de amprenavir -cisapride -disulfiram -dofetilida -dronedarona -inyeccin de paclitaxel -pimozida -solucin oral de ritonavir -solucin oral de sertralina -inyeccin de sulfametoxasol-trimetoprima -tioridazina -ziprasidona Esta medicina tambin puede interactuar con los siguientes medicamentos: -pldoras anticonceptivas -cimetidina -litio -otros medicamentos que prolongan el intervalo QT (provoca un ritmo cardiaco anormal) -fenobarbital -fenitona -warfarina Puede ser que esta lista no menciona todas las posibles interacciones. Informe a su profesional de [employer]	[place] de [employer] productos a base de hierbas, medicamentos de [place] o suplementos nutritivos que est tomando. Si usted fuma, consume bebidas alcohlicas o si utiliza drogas ilegales, indqueselo tambin a su profesional de [employer]	[place]. Algunas sustancias pueden interactuar con su medicamento. A qu debo estar atento al usar [employer]? Consulte a su mdico o a su profesional de la salud si sus sntomas no mejoran o si empeoran. Puede experimentar mareos o somnolencia. No conduzca ni utilice maquinaria ni haga nada que Associate Professor en estado de alerta hasta que sepa cmo le afecta este medicamento. No se siente ni se ponga de pie con rapidez, especialmente si es un paciente de edad avanzada. Esto reduce el riesgo de mareos o [employer]. Evite las bebidas alcohlicas durante el tratamiento con este medicamento y [employer] tres das siguientes. El alcohol puede causarle mareos o hacerlo sentir enfermo o ruborizado. Si est recibiendo tratamiento para una enfermedad de transmisin sexual, no tenga relaciones sexuales hasta que haya completado el [place]. Es posible que su pareja tambin necesite [place]. Qu efectos secundarios puedo tener al [employer] este  medicamento? Efectos secundarios que debe informar a su mdico o a Barrister's clerk de la salud tan pronto  como sea posible: -reacciones alrgicas como erupcin cutnea o urticarias, hinchazn de la cara, labios o lengua -confusin, torpeza -dificultad para hablar -mareos -decoloracin o dolor de la boca -fiebre, infeccin -entumecimiento, hormigueo, dolor o debilidad en las manos o los pies -dificultad para orinar o cambios en el volumen de orina -enrojecimiento, formacin de ampollas, descamacin o distensin de la piel, inclusive dentro de la boca -convulsiones -cansancio o debilidad inusual -irritacin, resequedad o flujo de la vagina Efectos secundarios que, por lo general, no requieren atencin mdica (debe informarlos a su mdico o a su profesional de la salud si persisten o si son molestos): -diarrea -dolor de cabeza -irritabilidad -sabor metlico -nuseas -calambres o dolores estomacales -dificultad para conciliar el sueo Puede ser que esta lista no menciona todos los posibles efectos secundarios. Comunquese a su mdico por asesoramiento mdico [employer]. Usted puede informar los efectos secundarios a la FDA por telfono al 1-800-FDA-1088. Dnde debo guardar mi medicina? Mantngala fuera del alcance de los nios. [place] a [employer], menos de 25 grados C (77 grados F). Protjala de la luz. Mantenga el envase bien cerrado. Deseche todo el medicamento que no haya utilizado, despus de la fecha de vencimiento. ATENCIN: Este folleto es un resumen. Puede ser que no cubra toda la posible informacin. Si usted tiene preguntas acerca de esta medicina, consulte con su mdico, su farmacutico o su profesional de Technical sales engineer.    2016, Elsevier/Gold Standard. (2014-11-16 00:00:00) Vaginosis bacteriana (Bacterial Vaginosis) La vaginosis bacteriana es una infeccin vaginal que perturba el equilibrio normal de las bacterias que se encuentran en la vagina. Es  el resultado de un crecimiento excesivo de ciertas bacterias. Esta es la infeccin vaginal ms frecuente en mujeres en edad reproductiva. El tratamiento es importante para prevenir complicaciones, especialmente en mujeres embarazadas, dado que puede causar un parto prematuro. CAUSAS  La vaginosis bacteriana se origina por un aumento de bacterias nocivas que, generalmente, estn presentes en cantidades ms pequeas en la vagina. Varios tipos diferentes de bacterias pueden causar esta afeccin. Sin embargo, la causa de su desarrollo no se comprende totalmente. [place] o comportamientos pueden exponerlo a un mayor riesgo de desarrollar vaginosis bacteriana, entre los que se incluyen:  Tener una nueva pareja sexual o mltiples parejas sexuales.  Las duchas vaginales  El uso del DIU (dispositivo intrauterino) como mtodo anticonceptivo. El contagio no se produce en baos, por ropas de cama, en piscinas o por contacto con objetos. SIGNOS Y SNTOMAS  Algunas mujeres que padecen vaginosis bacteriana no presentan signos ni sntomas. Los sntomas ms comunes son:  Secrecin vaginal de color grisceo.  Secrecin vaginal con olor similar al [employer], especialmente despus de Retail banker.  Picazn o sensacin de ardor en la vagina o la vulva.  Ardor o dolor al [employer]. DIAGNSTICO  Su mdico analizar su historia clnica y le examinar la vagina para detectar signos de vaginosis bacteriana. Puede tomarle Truddie Coco de flujo vaginal. Su mdico examinar esta muestra con un microscopio para controlar las bacterias y clulas anormales. Tambin puede realizarse un anlisis del pH vaginal.  TRATAMIENTO  La vaginosis bacteriana puede tratarse con antibiticos, en forma de comprimidos o de crema vaginal. Puede indicarse una segunda tanda de antibiticos si la afeccin se repite despus del tratamiento. Debido a que la vaginosis bacteriana aumenta el riesgo de  contraer enfermedades de transmisin sexual, el tratamiento puede ayudar a reducir el riesgo de clamidia, [place], VIH y herpes. INSTRUCCIONES PARA EL CUIDADO EN EL  [employer] solo medicamentos de venta libre o recetados, segn las indicaciones del mdico.  Si le han recetado antibiticos, tmelos como se le indic. Asegrese de que finaliza la prescripcin completa aunque se sienta mejor.  Comunique a sus compaeros sexuales que sufre una infeccin vaginal. Deben consultar a su mdico y recibir tratamiento si tienen problemas, como picazn o una erupcin cutnea leve.  Durante el [place], es importante que siga estas indicaciones:  Visual merchandiser relaciones sexuales o use preservativos de la forma correcta.  No se haga duchas vaginales.  Evite consumir alcohol como se lo haya indicado el mdico.  Community education officer se lo haya indicado el mdico. SOLICITE ATENCIN MDICA SI:   Sus sntomas no mejoran despus de 3 das de [place].  Aumenta la secrecin o Conservation officer, historic buildings.  Tiene fiebre. ASEGRESE DE QUE:   Comprende estas instrucciones.  Controlar su afeccin.  Recibir ayuda de inmediato si no mejora o si empeora. PARA OBTENER MS INFORMACIN  Centros para el control y la prevencin de Probation officer for Disease Control and Prevention, CDC): AppraiserFraud.fi Asociacin Estadounidense de la Salud Sexual (American Sexual Health Association, SHA): www.ashastd.org    Esta informacin no tiene Marine scientist el consejo del mdico. Asegrese de hacerle al mdico cualquier pregunta que tenga.   Document Released: 01/01/2008 Document Revised: 10/15/2014 Elsevier Interactive Patient Education 2016 [place] de diagnstico (Diagnostic Laparoscopy) La laparoscopia de diagnstico es un procedimiento que se hace para diagnosticar enfermedades en el abdomen. Durante su realizacin, se introduce en el abdomen un instrumento delgado del tamao de un lpiz  que tiene [place] luz, llamado laparoscopio, a travs de una incisin. El laparoscopio le permite al mdico observar los rganos internos. INFORME A SU MDICO:  Cualquier alergia que tenga.  Todos los [employer], incluidos vitaminas, hierbas, gotas oftlmicas, cremas y medicamentos de venta libre.  Problemas previos que usted o los UnitedHealth de su familia hayan tenido con el uso de anestsicos.  Enfermedades de la sangre que tenga.  Si tiene cirugas previas.  Enfermedades que tenga. RIESGOS Y COMPLICACIONES  En general, se trata de un procedimiento seguro. Sin embargo, es posible que haya problemas, que pueden incluir lo siguiente:  Infeccin.  Hemorragia.  Lesiones en los rganos circundantes.  Reaccin alrgica a la anestesia usada durante el procedimiento. ANTES DEL PROCEDIMIENTO  No coma ni beba nada despus de la medianoche anterior al procedimiento o segn lo que le haya indicado el mdico.  Consulte a su mdico acerca de estos temas:  Cambiar o suspender los medicamentos que toma habitualmente.  Tomar medicamentos, como aspirina e ibuprofeno. Estos medicamentos pueden tener un efecto anticoagulante en la [place]. No tome estos medicamentos antes del procedimiento si el mdico le indica que no lo haga.  Haga planes para que una persona lo lleve de vuelta a su casa despus del procedimiento. PROCEDIMIENTO  Le administrarn un medicamento para ayudarlo a relajarse (sedante).  Le administrarn un medicamento que lo har dormir (anestesia general).  Se inflar el abdomen con un gas, lo que facilitar la observacin.  Le practicarn incisiones pequeas en el abdomen.  A travs de las incisiones, se introducirn un laparoscopio y otros instrumentos pequeos en el abdomen.  Se puede tomar Truddie Coco de tejido de un rgano del abdomen para su anlisis.  Se retirarn los instrumentos del abdomen.  Se extraer el gas.  Las incisiones se cerrarn con puntos  (suturas). DESPUS DEL PROCEDIMIENTO  Le controlarn con frecuencia la presin  arterial, la frecuencia cardaca, la frecuencia respiratoria y el nivel de oxgeno en la sangre hasta que haya desaparecido el efecto de los medicamentos administrados.   Esta informacin no tiene Marine scientist el consejo del mdico. Asegrese de hacerle al mdico cualquier pregunta que tenga.   Document Released: 09/24/2005 Document Revised: 10/15/2014 Elsevier Interactive Patient Education 2016 [place] tumoral CA-125 (CA-125 Tumor Marker Test) POR QU ME DEBO REALIZAR ESTA PRUEBA? Esta prueba tiene Radiographer, therapeutic nivel del antgeno del cncer125 (CA-125) en la sangre. La prueba de marcador tumoral CA-125 puede ser til para Public affairs consultant de ovario y se realiza solamente si se considera que la mujer corre un alto riesgo de presentar este tipo de Hotel manager. El mdico puede recomendarle esta prueba si:  Usted tiene importantes antecedentes familiares de cncer de ovario.  Usted tiene un defecto gentico en el antgeno del cncer de mama (BRCA, por sus siglas en ingls). Si ya le han diagnosticado cncer de ovario, el mdico puede hacerle esta prueba como ayuda para identificar el grado de la enfermedad y Chief Technology Officer cmo responde al tratamiento. QU TIPO DE MUESTRA SE TOMA? Para esta prueba, se extrae Truddie Coco de [place]. Por lo general, para extraerla, se introduce una aguja en una vena. [place]? No se requiere una preparacin para esta prueba. [place]? Los valores de referencia son los valores saludables establecidos despus de realizarle el anlisis a un grupo grande de personas sanas. Pueden variar [employer], laboratorios y hospitales. Es su responsabilidad retirar el resultado del [place]. Consulte en el laboratorio o en el departamento en el que fue realizado el estudio cundo y cmo podr [employer]. El valor de referencia para esta prueba es de 0a 35unidades/ml o menos de 35unidades/l (unidades SI). [place]? Los niveles ms altos de CA-125 pueden indicar lo siguiente:  Ciertos tipos de cncer, entre ellos:  Cncer de ovario.  Cncer de pncreas.  Cncer de colon.  Cncer de pulmn.  Cncer de mama.  Linfomas.  Trastornos no cancerosos (benignos), entre ellos:  Cirrosis.  Embarazo.  Endometriosis.  Pancreatitis.  Enfermedad plvica inflamatoria (EPI). Hable con el mdico [employer], las opciones de tratamiento y, si es necesario, la necesidad de Optometrist ms [place]. Hable con el mdico si tiene [employer].   Esta informacin no tiene Marine scientist el consejo del mdico. Asegrese de hacerle al mdico cualquier pregunta que tenga.   Document Released: 07/15/2013 Document Revised: 10/15/2014 Elsevier Interactive Patient Education [employer].

## 2016-05-16 NOTE — Progress Notes (Signed)
   Patient is a 40 year old who was seen in the office in July 25 with a complaint of persistent left lower quadrant discomfort for several years after hysterectomy in 2012 but worse most recently. She states that there is nothing she does to aggravate is not associated with meals or bowel movements. She was treated for a suspected UTI and bacterial vaginosis with Cipro 250 twice a day for 3 days. Her urinalysis did not grow any microorganism. She is here for an ultrasound and further discussion. She did not appear to be any acute distress. Ultrasound today:  Absent uterus vaginal cuff negative. Right and left ovary were normal adjacent to the left adnexa was a tubular serpentine shaped mass measuring 25 x 25 x 23 mm folding on itself consistent with hydrosalpinx. No fluid in the cul-de-sac.  On exam both supine and erect during Valsalva maneuver there was no evidence of any inguinal, femoral, incisional or umbilical hernia.  Assessment/plan: Because of patient's fishy vaginal odor and evidence of BV last time it appears that the Cipro did not cover completely along with what was suspected to be her UTI. For this reason I'm going to prescribe Flagyl 500 mg one by mouth twice a day for 7 days. Patient fully aware that she cannot drink any alcohol that week. I explained to her that the ultrasound appears to be hydrosalpinx but before we consider laparoscopy for removing both fallopian tubes and for assessment of her pelvic cavity because of a persistent left low abdominal pain I will like her to see the gastroenterologist for possible colonoscopy and evaluation. If the evaluation is negative she is going to see me several weeks after to coordinate a time for her laparoscopy. A CA 125 was ordered today. Literature information was provided in [place].  Greater than 90% of the time was spent counseling correlating care for this patient with chronic left lower abdominal pain and bacterial vaginosis. Time of,  consultation 15 minutes

## 2016-05-17 ENCOUNTER — Telehealth: Payer: Self-pay | Admitting: *Deleted

## 2016-05-17 ENCOUNTER — Encounter: Payer: Self-pay | Admitting: Gastroenterology

## 2016-05-17 DIAGNOSIS — G8929 Other chronic pain: Secondary | ICD-10-CM

## 2016-05-17 DIAGNOSIS — R1032 Left lower quadrant pain: Principal | ICD-10-CM

## 2016-05-17 LAB — CA 125: CA 125: 7 U/mL (ref <35)

## 2016-05-17 NOTE — Telephone Encounter (Signed)
-----   Message from Terrance Mass, MD sent at 05/16/2016  3:35 PM EDT ----- Please schedule an appointment for this patient at the [facility] for this patient with chronic left lower abdominal pain prior hysterectomy. Patient speaks little [place]

## 2016-05-17 NOTE — Telephone Encounter (Signed)
Appointment on 07/23/16 @ 2:30pm with Dr.Jacobs at [place]  they will send her a new patient packet. Rosemarie Ax will relay

## 2016-05-18 ENCOUNTER — Other Ambulatory Visit: Payer: Self-pay | Admitting: Gynecology

## 2016-05-18 MED ORDER — NITROFURANTOIN MONOHYD MACRO 100 MG PO CAPS: 100.0000 mg | ORAL_CAPSULE | Freq: Two times a day (BID) | ORAL | 0 refills | Status: DC

## 2016-07-23 ENCOUNTER — Encounter (INDEPENDENT_AMBULATORY_CARE_PROVIDER_SITE_OTHER): Payer: Self-pay

## 2016-07-23 ENCOUNTER — Encounter: Payer: Self-pay | Admitting: Gastroenterology

## 2016-07-23 ENCOUNTER — Ambulatory Visit (INDEPENDENT_AMBULATORY_CARE_PROVIDER_SITE_OTHER): Payer: BLUE CROSS/BLUE SHIELD | Admitting: Gastroenterology

## 2016-07-23 VITALS — BP 94/60 | HR 96 | Ht 61.75 in | Wt 163.4 lb

## 2016-07-23 DIAGNOSIS — G8929 Other chronic pain: Secondary | ICD-10-CM

## 2016-07-23 DIAGNOSIS — R1032 Left lower quadrant pain: Secondary | ICD-10-CM | POA: Diagnosis not present

## 2016-07-23 MED ORDER — NA SULFATE-K SULFATE-MG SULF 17.5-3.13-1.6 GM/177ML PO SOLN: 1.0000 | Freq: Once | ORAL | 0 refills | Status: AC

## 2016-07-23 NOTE — Progress Notes (Signed)
HPI: This is a   very pleasant 40 year old woman    who was referred to me by Uvaldo Rising, MD (asking for colonoscopy prior to laparoscopy)  Chief complaint is left lower quadrant pain, chronic  She is here with her husband today.  2012 hysterectomy and aroiund that time she had llq pain. The pain is intermittent.    Various of body positions do not bring on the pain or worsen the pain. Sometimes the pain is very severe, slightly burning in nature. The pain will sometimes radiate down her left leg.  No GI bleeding.  advil will usually help.  Eating does not make it better or worse.  BMs does not effect the pain.  Colon cancer does not run in her family  05/2016 transvaginal US: Absent uterus vaginal cuff negative. Right and left ovary were normal adjacent to the left adnexa was a tubular serpentine shaped mass measuring 25 x 25 x 23 mm folding on itself consistent with hydrosalpinx. No fluid in the cul-de-sac.   Review of systems: Pertinent positive and negative review of systems were noted in the above HPI section. Complete review of systems was performed and was otherwise normal.   Past Medical History:  Diagnosis Date  . Asthma    no inhaler needed x 48yrs  . Fibroid uterus   . Hypothyroidism   . STD (sexually transmitted disease) 02/21/11   POS CHLAMYDIA  . Vaginal delivery    GRAVIDA 3 PARA 3  . 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 ABDOMINAL HYSTERECTOMY  04/25/2011   Procedure: HYSTERECTOMY ABDOMINAL;  Surgeon: Terrance Mass, MD;  Location: [facility];  Service: Gynecology;  Laterality: N/A;  Incision at 0913  . LAPAROSCOPIC TUBAL LIGATION      Current Outpatient Prescriptions  Medication Sig Dispense Refill  . albuterol (PROVENTIL HFA;VENTOLIN HFA) 108 (90 Base) MCG/ACT inhaler Inhale 1 puff into the lungs every 6 (six) hours as needed for wheezing or shortness of  breath.    . levothyroxine (SYNTHROID, LEVOTHROID) 50 MCG tablet Take 50 mcg by mouth daily before breakfast.     No current facility-administered medications for this visit.     Allergies as of 07/23/2016  . (No Known Allergies)    History reviewed. No pertinent family history.  Social History   Social History  . Marital status: Married    Spouse name: N/A  . Number of children: 3  . Years of education: N/A   Occupational History  . Not on file.   Social History Main Topics  . Smoking status: Never Smoker  . Smokeless tobacco: Never Used  . Alcohol use 0.0 oz/week     Comment: occasional  . Drug use: No  . Sexual activity: Yes    Birth control/ protection: Surgical     Comment: TAH   Other Topics Concern  . Not on file   Social History Narrative  . No narrative on file     Physical Exam: Ht 5' 1.75" (1.568 m) Comment: height measured without shoes  Wt 163 lb 6 oz (74.1 kg)   LMP 03/28/2011 (LMP Unknown)   BMI 30.12 kg/m  Constitutional: generally well-appearing Psychiatric: alert and oriented x3 Eyes: extraocular movements intact Mouth: oral pharynx moist, no lesions Neck: supple no lymphadenopathy Cardiovascular: heart regular rate and rhythm Lungs: clear to auscultation bilaterally Abdomen: soft, nontender, nondistended, no obvious ascites, no peritoneal signs, normal bowel sounds  Extremities: no lower extremity edema bilaterally Skin: no lesions on visible extremities   Assessment and plan: 40 y.o. female with  Chronic left lower quadrant pain, left sided hydrosalpinx  Her pain is not related to eating or moving her bowels. I positions do not affect the pain. Pain seems actually fairly low for GI related discomforts. Prior to laparoscopy I do think it is reasonable however to clear her colon to make sure there is no occult IBD, occult neoplasm.  We will therefore arrange for colonoscopy at her soonest convenience. I see no further reason for any blood  tests or imaging studies prior to then.   Owens Loffler, MD [facility] 07/23/2016, 2:38 PM  Cc: Uvaldo Rising, MD

## 2016-07-23 NOTE — Patient Instructions (Signed)
You will be set up for a colonoscopy for LLQ pain.

## 2016-08-20 ENCOUNTER — Encounter: Payer: Self-pay | Admitting: Gastroenterology

## 2016-09-03 ENCOUNTER — Encounter: Payer: BLUE CROSS/BLUE SHIELD | Admitting: Gastroenterology

## 2016-09-03 ENCOUNTER — Telehealth: Payer: Self-pay | Admitting: Gastroenterology

## 2016-09-03 NOTE — Telephone Encounter (Signed)
Yes, please charge her the usual fee for no show, per policy.

## 2016-12-06 DIAGNOSIS — E559 Vitamin D deficiency, unspecified: Secondary | ICD-10-CM

## 2016-12-06 HISTORY — DX: Vitamin D deficiency, unspecified: E55.9

## 2016-12-19 ENCOUNTER — Encounter (INDEPENDENT_AMBULATORY_CARE_PROVIDER_SITE_OTHER): Payer: Self-pay

## 2016-12-19 ENCOUNTER — Ambulatory Visit (INDEPENDENT_AMBULATORY_CARE_PROVIDER_SITE_OTHER): Payer: BLUE CROSS/BLUE SHIELD | Admitting: Gynecology

## 2016-12-19 ENCOUNTER — Encounter: Payer: Self-pay | Admitting: Gynecology

## 2016-12-19 VITALS — BP 120/74 | Ht 62.0 in | Wt 167.0 lb

## 2016-12-19 DIAGNOSIS — N949 Unspecified condition associated with female genital organs and menstrual cycle: Secondary | ICD-10-CM | POA: Diagnosis not present

## 2016-12-19 DIAGNOSIS — Z01411 Encounter for gynecological examination (general) (routine) with abnormal findings: Secondary | ICD-10-CM

## 2016-12-19 DIAGNOSIS — E038 Other specified hypothyroidism: Secondary | ICD-10-CM

## 2016-12-19 DIAGNOSIS — N9489 Other specified conditions associated with female genital organs and menstrual cycle: Secondary | ICD-10-CM

## 2016-12-19 DIAGNOSIS — M6289 Other specified disorders of muscle: Secondary | ICD-10-CM | POA: Diagnosis not present

## 2016-12-19 DIAGNOSIS — R102 Pelvic and perineal pain unspecified side: Secondary | ICD-10-CM

## 2016-12-19 DIAGNOSIS — D071 Carcinoma in situ of vulva: Secondary | ICD-10-CM | POA: Diagnosis not present

## 2016-12-19 NOTE — Progress Notes (Signed)
Abigail Reid 01-08-1976 [id number]   History:    41 y.o.  for annual gyn exam whose last complete exam was in 2016. 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 Patient's last colposcopic exam was in December 2016. Patient had an ultrasound on August 2017 is result complaining of left lower quadrant discomfort for several years after hysterectomy and the ultrasound at that time demonstrated the following:  Absent uterus vaginal cuff negative. Right and left ovary were normal adjacent to the left adnexa was a tubular serpentine shaped mass measuring 25 x 25 x 23 mm folding on itself consistent with hydrosalpinx. No fluid in the cul-de-sac.  Patient been referred to the gastroenterologist also consideration colonoscopy and patient has declined. Her symptoms come and go. She has no GU or GI complaints. She is complaining of tiredness and muscle fatigue. She does have history of hypothyroidism and has been on Synthroid 50 g daily but has not had her thyroid checked in 2 years and her work have been giving her generic Synthroid which she stated she does not like how she is feeling and she has not had her TSH level checked. Patient's last Pap smear was in 2016 which was normal.   Past medical history,surgical history, family history and social history were all reviewed and documented in the EPIC chart.  Gynecologic History Patient's last menstrual period was 03/28/2011 (lmp unknown). Contraception: status post hysterectomy Last Pap: 2016. Results were: normal Last mammogram: 2017. Results were: normal  Obstetric History OB History  Gravida  Para Term Preterm AB Living  3 3 3     3   SAB TAB Ectopic Multiple Live Births          3    # Outcome Date GA Lbr Len/2nd Weight Sex Delivery Anes PTL Lv  3 Term     F Vag-Spont  N LIV  2 Term     F Vag-Spont  N LIV  1 Term     F Vag-Spont  N LIV       ROS: A ROS was performed and pertinent positives and negatives are included in the history.  GENERAL: No fevers or chills. HEENT: No change in vision, no earache, sore throat or sinus congestion. NECK: No pain or stiffness. CARDIOVASCULAR: No chest pain or pressure. No palpitations. PULMONARY: No shortness of breath, cough or wheeze. GASTROINTESTINAL: No abdominal pain, nausea, vomiting or diarrhea, melena or bright red blood per rectum. GENITOURINARY: No urinary frequency, urgency, hesitancy or dysuria. MUSCULOSKELETAL: No joint or muscle pain, no back pain, no recent trauma. DERMATOLOGIC: No rash, no itching, no lesions. ENDOCRINE: No polyuria, polydipsia, no heat or cold intolerance. No recent change in weight. HEMATOLOGICAL: No anemia or easy bruising or bleeding. NEUROLOGIC: No headache, seizures, numbness, tingling or weakness. PSYCHIATRIC: No depression, no loss of interest in normal activity or change in sleep pattern.     Exam: chaperone present  BP 120/74   Ht 5\' 2"  (1.575 m)   Wt 167 lb (75.8 kg)   LMP 03/28/2011 (LMP Unknown)   BMI 30.54 kg/m   Body mass index is 30.54 kg/m.  General appearance : Well  developed well nourished female. No acute distress HEENT: Eyes: no retinal hemorrhage or exudates,  Neck supple, trachea midline, no carotid bruits, no thyroidmegaly Lungs: Clear to auscultation, no rhonchi or wheezes, or rib retractions  Heart: Regular rate and rhythm, no murmurs or gallops Breast:Examined in sitting and supine position were symmetrical in appearance, no palpable masses or tenderness,  no skin retraction, no nipple inversion, no nipple discharge, no skin discoloration, no axillary or supraclavicular  lymphadenopathy Abdomen: no palpable masses or tenderness, no rebound or guarding Extremities: no edema or skin discoloration or tenderness  Pelvic:  Bartholin, Urethra, Skene Glands: Within normal limits             Vagina: No gross lesions or discharge  Cervix: Absent  Uterus  absent  Adnexa  Without masses or tenderness  Anus and perineum  normal   Rectovaginal  normal sphincter tone without palpated masses or tenderness             Hemoccult not indicated     Assessment/Plan:  41 y.o. female for annual exam with past history of VIN III several years ago treated with Aldara no evidence of recurrence magnification lens was used today to inspect the external genitalia. A Pap smear was done today. Patient will return back to the office next week for an ultrasound for follow-up on the left adnexal discomfort and she has been having to see if her hydrosalpinx which this is what it appears to be has changed or not. She will also have the following fasting blood work: Comprehensive metabolic panel, fasting lipid profile, and because of her history of hypothyroidism will check her TSH along with her CBC.    Terrance Mass MD, 2:33 PM 12/19/2016

## 2016-12-19 NOTE — Addendum Note (Signed)
Addended by: Nelva Nay on: 12/19/2016 02:45 PM   Modules accepted: Orders

## 2016-12-21 LAB — PAP IG W/ RFLX HPV ASCU

## 2016-12-31 ENCOUNTER — Ambulatory Visit (INDEPENDENT_AMBULATORY_CARE_PROVIDER_SITE_OTHER): Payer: BLUE CROSS/BLUE SHIELD | Admitting: Gynecology

## 2016-12-31 ENCOUNTER — Ambulatory Visit (INDEPENDENT_AMBULATORY_CARE_PROVIDER_SITE_OTHER): Payer: BLUE CROSS/BLUE SHIELD

## 2016-12-31 ENCOUNTER — Encounter: Payer: Self-pay | Admitting: Gynecology

## 2016-12-31 VITALS — BP 116/70

## 2016-12-31 DIAGNOSIS — R102 Pelvic and perineal pain: Secondary | ICD-10-CM

## 2016-12-31 DIAGNOSIS — N9489 Other specified conditions associated with female genital organs and menstrual cycle: Secondary | ICD-10-CM

## 2016-12-31 DIAGNOSIS — N949 Unspecified condition associated with female genital organs and menstrual cycle: Secondary | ICD-10-CM

## 2016-12-31 DIAGNOSIS — E038 Other specified hypothyroidism: Secondary | ICD-10-CM | POA: Diagnosis not present

## 2016-12-31 DIAGNOSIS — Z01411 Encounter for gynecological examination (general) (routine) with abnormal findings: Secondary | ICD-10-CM | POA: Diagnosis not present

## 2016-12-31 DIAGNOSIS — M6289 Other specified disorders of muscle: Secondary | ICD-10-CM | POA: Diagnosis not present

## 2016-12-31 LAB — CBC WITH DIFFERENTIAL/PLATELET
BASOS ABS: 0 {cells}/uL (ref 0–200)
BASOS PCT: 0 %
EOS PCT: 3 %
Eosinophils Absolute: 234 cells/uL (ref 15–500)
HCT: 38.8 % (ref 35.0–45.0)
Hemoglobin: 12.8 g/dL (ref 11.7–15.5)
Lymphocytes Relative: 37 %
Lymphs Abs: 2886 cells/uL (ref 850–3900)
MCH: 31.3 pg (ref 27.0–33.0)
MCHC: 33 g/dL (ref 32.0–36.0)
MCV: 94.9 fL (ref 80.0–100.0)
MPV: 9.1 fL (ref 7.5–12.5)
Monocytes Absolute: 390 cells/uL (ref 200–950)
Monocytes Relative: 5 %
NEUTROS ABS: 4290 {cells}/uL (ref 1500–7800)
Neutrophils Relative %: 55 %
PLATELETS: 297 10*3/uL (ref 140–400)
RBC: 4.09 MIL/uL (ref 3.80–5.10)
RDW: 13.8 % (ref 11.0–15.0)
WBC: 7.8 10*3/uL (ref 3.8–10.8)

## 2016-12-31 LAB — COMPREHENSIVE METABOLIC PANEL
ALBUMIN: 3.8 g/dL (ref 3.6–5.1)
ALK PHOS: 59 U/L (ref 33–115)
ALT: 11 U/L (ref 6–29)
AST: 14 U/L (ref 10–30)
BILIRUBIN TOTAL: 0.3 mg/dL (ref 0.2–1.2)
BUN: 9 mg/dL (ref 7–25)
CO2: 27 mmol/L (ref 20–31)
CREATININE: 0.72 mg/dL (ref 0.50–1.10)
Calcium: 9.1 mg/dL (ref 8.6–10.2)
Chloride: 108 mmol/L (ref 98–110)
Glucose, Bld: 95 mg/dL (ref 65–99)
Potassium: 4.1 mmol/L (ref 3.5–5.3)
SODIUM: 142 mmol/L (ref 135–146)
TOTAL PROTEIN: 6.6 g/dL (ref 6.1–8.1)

## 2016-12-31 LAB — LIPID PANEL
CHOLESTEROL: 175 mg/dL (ref <200)
HDL: 67 mg/dL (ref >50)
LDL Cholesterol: 97 mg/dL (ref <100)
TRIGLYCERIDES: 56 mg/dL (ref <150)
Total CHOL/HDL Ratio: 2.6 Ratio (ref <5.0)
VLDL: 11 mg/dL (ref <30)

## 2016-12-31 LAB — TSH: TSH: 2.5 mIU/L

## 2016-12-31 MED ORDER — LEVOTHYROXINE SODIUM 50 MCG PO TABS: 50.0000 ug | ORAL_TABLET | Freq: Every day | ORAL | 11 refills | Status: AC

## 2016-12-31 NOTE — Progress Notes (Signed)
   Patient was seen the office for her annual exam on 12/19/2016 see previous note for detail. She is here for ultrasound follow-up due to the fact in August 2017 she been complaining of left lower quadrant discomfort for several years after hysterectomy and the ultrasound at that time demonstrated the following:  Absent uterus vaginal cuff negative. Right and left ovary were normal adjacent to the left adnexa was a tubular serpentine shaped mass measuring 25 x 25 x 23 mm folding on itself consistent with hydrosalpinx. No fluid in the cul-de-sac.  She was asymptomatic today and is here fasting for her blood work as well and needed a refill for her Synthroid.  Ultrasound today: Absent uterus some small amount of fluid in the cul-de-sac. Right ovarian follicle 12 x 11 mm left ovary not seen. Left tubular mass no longer present. Excessive bowel shadowing.  Assessment/plan: Patient with what appeared to be in the past a hydrosalpinx no longer present although there was some shadowing from bowel. Patient was otherwise asymptomatic. Fasting blood work today. Prescription refill for Synthroid provided. Patient was reminded to schedule her overdue mammogram. Otherwise patient scheduled to return back in one year or when necessary.

## 2017-01-01 ENCOUNTER — Encounter: Payer: Self-pay | Admitting: Gynecology

## 2017-01-01 ENCOUNTER — Other Ambulatory Visit: Payer: Self-pay | Admitting: Anesthesiology

## 2017-01-01 DIAGNOSIS — E559 Vitamin D deficiency, unspecified: Secondary | ICD-10-CM

## 2017-01-01 LAB — VITAMIN D 25 HYDROXY (VIT D DEFICIENCY, FRACTURES): VIT D 25 HYDROXY: 19 ng/mL — AB (ref 30–100)

## 2017-01-01 MED ORDER — VITAMIN D (ERGOCALCIFEROL) 1.25 MG (50000 UNIT) PO CAPS: 50000.0000 [IU] | ORAL_CAPSULE | ORAL | 0 refills | Status: DC

## 2017-01-31 ENCOUNTER — Ambulatory Visit (INDEPENDENT_AMBULATORY_CARE_PROVIDER_SITE_OTHER): Payer: BLUE CROSS/BLUE SHIELD

## 2017-01-31 ENCOUNTER — Ambulatory Visit (INDEPENDENT_AMBULATORY_CARE_PROVIDER_SITE_OTHER): Payer: BLUE CROSS/BLUE SHIELD | Admitting: Emergency Medicine

## 2017-01-31 VITALS — BP 112/69 | HR 60 | Temp 98.2°F | Resp 16 | Ht 62.0 in | Wt 169.8 lb

## 2017-01-31 DIAGNOSIS — J4520 Mild intermittent asthma, uncomplicated: Secondary | ICD-10-CM

## 2017-01-31 DIAGNOSIS — R0602 Shortness of breath: Secondary | ICD-10-CM | POA: Diagnosis not present

## 2017-01-31 DIAGNOSIS — R079 Chest pain, unspecified: Secondary | ICD-10-CM | POA: Insufficient documentation

## 2017-01-31 MED ORDER — AZITHROMYCIN 250 MG PO TABS: ORAL_TABLET | ORAL | 0 refills | Status: DC

## 2017-01-31 MED ORDER — ALBUTEROL SULFATE HFA 108 (90 BASE) MCG/ACT IN AERS: 1.0000 | INHALATION_SPRAY | Freq: Four times a day (QID) | RESPIRATORY_TRACT | 3 refills | Status: DC | PRN

## 2017-01-31 MED ORDER — PREDNISONE 20 MG PO TABS: 40.0000 mg | ORAL_TABLET | Freq: Every day | ORAL | 0 refills | Status: AC

## 2017-01-31 NOTE — Patient Instructions (Addendum)
   IF you received an x-ray today, you will receive an invoice from [facility]. Please contact [facility] at [phone] with questions or concerns regarding your invoice.   IF you received labwork today, you will receive an invoice from [facility]. Please contact [facility] at [phone] with questions or concerns regarding your invoice.   Our billing staff will not be able to assist you with questions regarding bills from these companies.  You will be contacted with the lab results as soon as they are available. The fastest way to get your results is to activate your My Chart account. Instructions are located on the last page of this paperwork. If you have not heard from us regarding the results in 2 weeks, please contact this office.      Asma - Adultos (Asthma, Adult) El asma es una enfermedad de los pulmones en la que las vas respiratorias se estrechan y obstruyen. El asma puede causar dificultad para respirar. El asma no puede curarse, pero los medicamentos y los cambios en el estilo de vida pueden ayudar a controlarla. Los siguientes factores pueden iniciar (desencadenar) el asma:  Escamas de la piel de los animales (caspa).  Polvo.  Cucarachas.  Polen.  Moho.  Humo.  Productos de limpieza.  Aerosoles para el cabello.  Vapores de pintura u olores fuertes.  Aire fro, cambios climticos y vientos.  Llanto o risa intensos.  Estrs.  Ciertos medicamentos o drogas.  Alimentos, como frutas secas, papas fritas y vinos espumantes.  Infecciones o afecciones (resfros, gripe).  Haga actividad fsica.  Ciertas enfermedades o afecciones.  Ejercicio o actividades extenuantes. CUIDADOS EN EL HOGAR  Tome los medicamentos como le indic el mdico.  Use un medidor de flujo espiratorio mximo como le indic su mdico. El medidor de flujo espiratorio mximo es una herramienta que mide el funcionamiento de los pulmones.  Anote y lleve un registro  de los valores del medidor de flujo espiratorio mximo.  Conozca el plan de accin para el asma y selo. El plan de accin para el asma es una planificacin por escrito para el control y tratamiento de sus crisis asmticas.  Para prevenir las crisis asmticas: ? No fume. Aljese del humo de otros fumadores. ? Cambie el filtro de la calefaccin y del aire acondicionado con frecuencia. ? Limite el uso de hogares o estufas a lea. ? Elimine las plagas (como cucarachas, ratones) y sus excrementos. ? Elimine las plantas si observa moho en ellas. ? Limpie los pisos. Elimine el polvo regularmente. Use productos de limpieza que sean inoloros. ? Pdale a alguien que pase la aspiradora cuando usted no se encuentre en casa. Utilice una aspiradora con filtros HEPA, siempre que le sea posible. ? Reemplace las alfombras por pisos de madera, baldosas o vinilo. Las alfombras pueden retener las escamas de la piel de los animales y el polvo. ? Use almohadas, mantas y cubre colchones antialrgicos. ? Lave las sbanas y las mantas todas las semanas con agua caliente y squelas con aire caliente. ? Use mantas de polister o algodn. ? Limpie baos y cocinas con lavandina. Si fuera posible, pdale a alguien que vuelva a pintar las paredes de estas habitaciones con una pintura resistente a los hongos. Aljese de las habitaciones que se estn limpiando y pintando. ? Lvese las manos con frecuencia.  SOLICITE AYUDA SI:  Hace un silbido al respirar (sibilancias), tiene falta de aire o tiene tos incluso despus de tomar los medicamentos para prevenir crisis.  El moco   coloreado que elimina (esputo) es ms denso de lo normal.  El moco coloreado que elimina cambia de trasparente o blanco a [place], verde, gris o sanguinolento.  Tiene problemas causados por el medicamento que toma, por ejemplo: ? Una erupcin. ? Picazn. ? Hinchazn. ? Problemas respiratorios.  Necesita un medicamento de alivio ms de 2 a 3veces  por semana.  El flujo espiratorio mximo an est en 50 a 79% del mejor valor personal despus de seguir el plan de accin durante 1hora.  Tiene fiebre.  SOLICITE AYUDA DE INMEDIATO SI:  Parece empeorar y no responde al medicamento durante una crisis asmtica.  Le falta el aire, incluso en reposo.  Le falta el aire incluso cuando hace muy poca actividad fsica.  Tiene dificultad para comer, beber o hablar.  Siente dolor en el pecho.  La frecuencia cardaca est acelerada.  Sus labios o uas comienzan a volverse azules.  Usted se siente mareado, dbil o se desmaya.  Su flujo mximo es menor del 50% del mejor valor personal.  Esta informacin no tiene como fin reemplazar el consejo del mdico. Asegrese de hacerle al mdico cualquier pregunta que tenga. Document Released: 12/21/2008 Document Revised: 06/15/2015 Document Reviewed: 04/23/2013 Elsevier Interactive Patient Education  2017 Elsevier Inc.  

## 2017-01-31 NOTE — Progress Notes (Signed)
[place] 41 y.o.   Chief Complaint  Patient presents with  . Shortness of Breath    hurts around her rib cage when she tries to breathe, x 2 weeks and headaches     HISTORY OF PRESENT ILLNESS: This is a 41 y.o. female complaining of pain to right side of chest wall x 2 weeks; also has h/o asthma and c/o intermittent sob and wheezing x several days; dry cough; +nasal congestion.  HPI   Prior to Admission medications   Medication Sig Start Date End Date Taking? Authorizing Provider  albuterol (PROVENTIL HFA;VENTOLIN HFA) 108 (90 Base) MCG/ACT inhaler Inhale 1 puff into the lungs every 6 (six) hours as needed for wheezing or shortness of breath.   Yes Historical Provider, MD  levothyroxine (SYNTHROID, LEVOTHROID) 50 MCG tablet Take 1 tablet (50 mcg total) by mouth daily before breakfast. 12/31/16  Yes Terrance Mass, MD  Vitamin D, Ergocalciferol, (DRISDOL) 50000 units CAPS capsule Take 1 capsule (50,000 Units total) by mouth every 7 (seven) days. 01/01/17  Yes Terrance Mass, MD    No Known Allergies  Patient Active Problem List   Diagnosis Date Noted  . Asthma with acute exacerbation 01/27/2015  . Melasma 06/24/2013  . Hypothyroidism 06/13/2012  . 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 Weight gain 06/05/2012    Past Medical History:  Diagnosis Date  . Asthma    no inhaler needed x 29yr  . Fibroid uterus   . Hypothyroidism   . STD (sexually transmitted disease) 02/21/11   POS CHLAMYDIA  . Vaginal delivery    GRAVIDA 3 PARA 3  . 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 Vitamin D deficiency 12/2016    Past Surgical History:  Procedure Laterality Date  . ABDOMINAL HYSTERECTOMY  04/25/2011   Procedure: HYSTERECTOMY ABDOMINAL;  Surgeon: JTerrance Mass MD;  [facility];  Service: Gynecology;  Laterality: N/A;  Incision at 0913  . LAPAROSCOPIC TUBAL LIGATION      Social History   Social  History  . Marital status: Married    Spouse name: N/A  . Number of children: 3  . Years of education: N/A   Occupational History  . Not on file.   Social History Main Topics  . Smoking status: Never Smoker  . Smokeless tobacco: Never Used  . Alcohol use 0.0 oz/week     Comment: occasional  . Drug use: No  . Sexual activity: Yes    Birth control/ protection: Surgical     Comment: TAH   Other Topics Concern  . Not on file   Social History Narrative  . No narrative on file    No family history on file.   Review of Systems  Constitutional: Negative.  Negative for chills, fever and malaise/fatigue.  HENT: Positive for congestion.   Eyes: Negative.  Negative for discharge and redness.  Respiratory: Positive for cough, shortness of breath and wheezing.   Cardiovascular: Positive for chest pain.  Gastrointestinal: Negative for abdominal pain, diarrhea, nausea and vomiting.  Genitourinary: Negative for dysuria and hematuria.  Musculoskeletal: Negative for back pain, myalgias and neck pain.  Skin: Negative for rash.  Neurological: Negative for dizziness, sensory change, focal weakness and headaches.  Endo/Heme/Allergies: Negative.   All other systems reviewed and are negative.  Vitals:   01/31/17 1541  BP: 112/69  Pulse: 60  Resp: 16  Temp: 98.2 F (36.8 C)     Physical Exam  Constitutional: She is oriented to person, place, and time. She appears well-developed and well-nourished.  HENT:  Head: Normocephalic and atraumatic.  Eyes: Conjunctivae and EOM are normal. Pupils are equal, round, and reactive to light.  Neck: Normal range of motion. Neck supple. No JVD present. No thyromegaly present.  Cardiovascular: Normal rate, regular rhythm and normal heart sounds.   Pulmonary/Chest: Effort normal and breath sounds normal.  Abdominal: Soft. Bowel sounds are normal. There is no tenderness.  Musculoskeletal: Normal range of motion.  Lymphadenopathy:    She has no  cervical adenopathy.  Neurological: She is alert and oriented to person, place, and time. No sensory deficit. She exhibits normal muscle tone.  Skin: Skin is warm and dry. Capillary refill takes less than 2 seconds. No rash noted.  Psychiatric: She has a normal mood and affect. Her behavior is normal.  Vitals reviewed.  CXR: NAD  ASSESSMENT & PLAN: Khandi was seen today for shortness of breath.  Diagnoses and all orders for this visit:  Mild intermittent asthma without complication -     DG Chest 2 View; Future  Right-sided chest pain Comments: r/o pleurisy  Other orders -     predniSONE (DELTASONE) 20 MG tablet; Take 2 tablets (40 mg total) by mouth daily with breakfast. -     albuterol (PROVENTIL HFA;VENTOLIN HFA) 108 (90 Base) MCG/ACT inhaler; Inhale 1-2 puffs into the lungs every 6 (six) hours as needed for wheezing or shortness of breath. -     azithromycin (ZITHROMAX) 250 MG tablet; Sig as indicated    Patient Instructions       IF you received an x-ray today, you will receive an invoice from [facility]. Please contact [facility] at [phone] with questions or concerns regarding your invoice.   IF you received labwork today, you will receive an invoice from [facility]. Please contact [facility] at [phone] with questions or concerns regarding your invoice.   Our billing staff will not be able to assist you with questions regarding bills from these companies.  You will be contacted with the lab results as soon as they are available. The fastest way to get your results is to activate your My Chart account. Instructions are located on the last page of this paperwork. If you have not heard from [place] regarding the results in 2 weeks, please contact this office.      Asma - Adultos (Asthma, Adult) El asma es una enfermedad de los pulmones en la que las vas respiratorias se Investment banker, corporate y [place]. El asma puede causar dificultad para respirar. El asma  no puede curarse, pero los [employer] y los cambios en el estilo de vida pueden ayudar a Therapist, sports. Los siguientes factores pueden iniciar (desencadenar) el asma:  Escamas de la piel de los animales (caspa).  Polvo.  Cucarachas.  Polen.  Moho.  Humo.  Productos de limpieza.  Aerosoles para el cabello.  Vapores de pintura u olores fuertes.  Aire fro, cambios climticos y vientos.  Llanto o risa intensos.  Estrs.  Ciertos medicamentos o drogas.  Alimentos, como frutas secas, papas fritas y vinos espumantes.  Infecciones o afecciones (resfros, gripe).  Haga actividad fsica.  Ciertas enfermedades o afecciones.  Ejercicio o actividades extenuantes. CUIDADOS EN EL HOGAR  Tome los Chief Technology Officer.  Use un medidor de flujo espiratorio mximo como le indic su mdico. El medidor de flujo espiratorio mximo es una herramienta que mide el funcionamiento de los pulmones.  Anote y lleve un registro  de los valores del medidor de flujo espiratorio mximo.  Conozca el plan de accin para el asma y selo. El plan de accin para el asma es una planificacin por escrito para el control y tratamiento de sus crisis asmticas.  Para prevenir las crisis asmticas:  No fume. Aljese del humo de otros fumadores.  Cambie el filtro de la calefaccin y del aire acondicionado con frecuencia.  Limite el uso de hogares o estufas a lea.  Elimine las plagas (como cucarachas, ratones) y sus excrementos.  Elimine las plantas si observa moho en ellas.  Limpie los pisos. Elimine el polvo regularmente. Use productos de limpieza que sean inoloros.  Pdale a alguien que pase la aspiradora cuando usted no se encuentre en casa. Utilice una aspiradora con filtros HEPA, siempre que le sea posible.  Reemplace las alfombras por pisos de [place], baldosas o vinilo. Las alfombras pueden retener las escamas de la piel de los animales y [place].  Use almohadas, mantas y  cubre colchones antialrgicos.  [place] sbanas y las mantas todas las semanas con agua caliente y squelas con aire caliente.  Use mantas de polister o algodn.  Limpie baos y cocinas con lavandina. Si fuera posible, pdale a alguien que vuelva a pintar las paredes de estas habitaciones con Ardelia Mems pintura resistente a los hongos. Aljese de las habitaciones que se estn limpiando y pintando.  Lvese las manos con frecuencia. SOLICITE AYUDA SI:  Hace un silbido al respirar (sibilancias), tiene falta de aire o tiene tos incluso despus de tomar los medicamentos para prevenir crisis.  El moco coloreado que elimina (esputo) es ms denso de lo normal.  El moco coloreado que elimina cambia de trasparente o blanco a [place], [place], gris o sanguinolento.  Tiene problemas causados por el medicamento que toma, por ejemplo:  Una erupcin.  Picazn.  Hinchazn.  Problemas respiratorios.  Necesita un medicamento de alivio ms de 2 a 3veces por semana.  El flujo espiratorio mximo an est en 45 a 79% del Pharmacist, hospital personal despus de seguir el plan de accin durante 1hora.  Tiene fiebre. SOLICITE AYUDA DE INMEDIATO SI:  Parece empeorar y no responde al medicamento durante una crisis asmtica.  Le falta el aire, incluso en reposo.  Le falta el aire incluso cuando hace muy poca actividad fsica.  Tiene dificultad para comer, beber o hablar.  Siente dolor en el pecho.  La frecuencia cardaca est acelerada.  Sus labios o uas comienzan a Environmental education officer.  Usted se siente mareado, dbil o se desmaya.  Su flujo mximo es Garment/textile technologist del 50% del Pharmacist, hospital personal. Esta informacin no tiene Marine scientist el consejo del mdico. Asegrese de hacerle al mdico cualquier pregunta que tenga. Document Released: 12/21/2008 Document Revised: 06/15/2015 Document Reviewed: 04/23/2013 Elsevier Interactive Patient Education  2017 Elsevier Inc.      Agustina Caroli, MD [facility]

## 2017-02-20 ENCOUNTER — Encounter: Payer: Self-pay | Admitting: Gynecology

## 2017-05-29 DIAGNOSIS — M9906 Segmental and somatic dysfunction of lower extremity: Secondary | ICD-10-CM | POA: Diagnosis not present

## 2017-05-29 DIAGNOSIS — M7631 Iliotibial band syndrome, right leg: Secondary | ICD-10-CM | POA: Diagnosis not present

## 2017-06-01 DIAGNOSIS — M9906 Segmental and somatic dysfunction of lower extremity: Secondary | ICD-10-CM | POA: Diagnosis not present

## 2017-06-01 DIAGNOSIS — M7631 Iliotibial band syndrome, right leg: Secondary | ICD-10-CM | POA: Diagnosis not present

## 2017-06-20 ENCOUNTER — Ambulatory Visit: Payer: BLUE CROSS/BLUE SHIELD | Admitting: Obstetrics & Gynecology

## 2017-06-26 ENCOUNTER — Encounter: Payer: Self-pay | Admitting: Women's Health

## 2017-06-26 ENCOUNTER — Ambulatory Visit (INDEPENDENT_AMBULATORY_CARE_PROVIDER_SITE_OTHER): Payer: BLUE CROSS/BLUE SHIELD | Admitting: Women's Health

## 2017-06-26 VITALS — BP 122/80

## 2017-06-26 DIAGNOSIS — N898 Other specified noninflammatory disorders of vagina: Secondary | ICD-10-CM | POA: Diagnosis not present

## 2017-06-26 DIAGNOSIS — E559 Vitamin D deficiency, unspecified: Secondary | ICD-10-CM | POA: Diagnosis not present

## 2017-06-26 DIAGNOSIS — N760 Acute vaginitis: Secondary | ICD-10-CM | POA: Diagnosis not present

## 2017-06-26 DIAGNOSIS — B9689 Other specified bacterial agents as the cause of diseases classified elsewhere: Secondary | ICD-10-CM | POA: Diagnosis not present

## 2017-06-26 DIAGNOSIS — Z23 Encounter for immunization: Secondary | ICD-10-CM

## 2017-06-26 LAB — WET PREP FOR TRICH, YEAST, CLUE

## 2017-06-26 MED ORDER — METRONIDAZOLE 500 MG PO TABS: 500.0000 mg | ORAL_TABLET | Freq: Two times a day (BID) | ORAL | 0 refills | Status: DC

## 2017-06-26 NOTE — Progress Notes (Signed)
Interpreter (daughter) present for entirety of visit  Presents with symptoms of vaginal odor, itching and white discharge x 1 month. Denies dysuria, dysparenia, fever, or abdominal pain. Using vagisil soap to clean vagina since symptoms started with no relief. Has also noticed intermittent L sided cramping/discomfort the last two weeks, however hx of constipation and symptoms relieved with Miralax. [place] 2012, VIN III (perineum/perirectal) 2010; treated with Aldara, hypothyroidism, Vit D deficiency-completed 12 weeks of Vit D 50,000, but did not take a supplement after.  Exam: Appears well. External genitalia normal. Speculum exam-  minimal white discharge present. Wet mount with clue cells , bacteria TNTC. Bimanual exam- nontender, no fullness noted.   Bacterial Vaginosis Vit D deficiency  Plan. Metronidazole 500mg  BID x 7 days. ETOH precautions reviewed. Encouraged to call if symptoms not resolved. Vitamin D level rechecked-  Reminded to have annual mammogram- scheduling information provided. Continue Miralax as needed, constipation prevention reviewed.

## 2017-06-26 NOTE — Patient Instructions (Signed)
Vaginosis bacteriana (Bacterial Vaginosis) La vaginosis bacteriana es una infeccin de la vagina. Se produce cuando crece una cantidad excesiva de grmenes normales (bacterias sanas) en la vagina. Esta infeccin aumenta el riesgo de contraer otras infecciones de transmisin sexual. El tratamiento de esta infeccin puede ayudar a reducir el riesgo de otras infecciones, como:  Clamidia.  Gonorrea.  VIH.  Herpes. CUIDADOS EN EL HOGAR  Tome los medicamentos tal como se lo indic su mdico.  Finalice la prescripcin completa, aunque comience a sentirse mejor.  Comunique a sus compaeros sexuales que sufre una infeccin. Deben consultar a su mdico para iniciar un tratamiento.  Durante el tratamiento: ? Evite mantener relaciones sexuales o use preservativos de la forma correcta. ? No se haga duchas vaginales. ? No consuma alcohol a menos que el mdico lo autorice. ? No amamante a menos que el mdico la autorice.  SOLICITE AYUDA SI:  No mejora luego de 3 das de tratamiento.  Observa una secrecin (prdida) de color gris ms abundante que proviene de la vagina.  Siente ms dolor que antes.  Tiene fiebre.  ASEGRESE DE QUE:  Comprende estas instrucciones.  Controlar su afeccin.  Recibir ayuda de inmediato si no mejora o si empeora.  Esta informacin no tiene como fin reemplazar el consejo del mdico. Asegrese de hacerle al mdico cualquier pregunta que tenga. Document Released: 12/21/2008 Document Revised: 01/16/2016 Document Reviewed: 05/06/2013 Elsevier Interactive Patient Education  2017 Elsevier Inc.  

## 2017-06-27 LAB — VITAMIN D 25 HYDROXY (VIT D DEFICIENCY, FRACTURES): VIT D 25 HYDROXY: 24 ng/mL — AB (ref 30–100)

## 2017-07-03 ENCOUNTER — Other Ambulatory Visit: Payer: Self-pay | Admitting: Anesthesiology

## 2017-07-03 MED ORDER — VITAMIN D (ERGOCALCIFEROL) 1.25 MG (50000 UNIT) PO CAPS: 50000.0000 [IU] | ORAL_CAPSULE | ORAL | 0 refills | Status: DC

## 2017-11-27 DIAGNOSIS — M79672 Pain in left foot: Secondary | ICD-10-CM | POA: Diagnosis not present

## 2017-11-27 DIAGNOSIS — M79671 Pain in right foot: Secondary | ICD-10-CM | POA: Diagnosis not present

## 2018-06-26 DIAGNOSIS — Z90710 Acquired absence of both cervix and uterus: Secondary | ICD-10-CM | POA: Diagnosis not present

## 2018-06-26 DIAGNOSIS — K5900 Constipation, unspecified: Secondary | ICD-10-CM | POA: Diagnosis not present

## 2018-06-26 DIAGNOSIS — E278 Other specified disorders of adrenal gland: Secondary | ICD-10-CM | POA: Diagnosis not present

## 2018-06-26 DIAGNOSIS — K5790 Diverticulosis of intestine, part unspecified, without perforation or abscess without bleeding: Secondary | ICD-10-CM | POA: Diagnosis not present

## 2018-06-26 DIAGNOSIS — R1032 Left lower quadrant pain: Secondary | ICD-10-CM | POA: Diagnosis not present

## 2018-07-16 ENCOUNTER — Ambulatory Visit: Payer: BLUE CROSS/BLUE SHIELD | Admitting: Obstetrics & Gynecology

## 2018-07-16 ENCOUNTER — Encounter: Payer: Self-pay | Admitting: Obstetrics & Gynecology

## 2018-07-16 VITALS — BP 128/86 | Ht 61.75 in | Wt 164.0 lb

## 2018-07-16 DIAGNOSIS — Z01419 Encounter for gynecological examination (general) (routine) without abnormal findings: Secondary | ICD-10-CM | POA: Diagnosis not present

## 2018-07-16 DIAGNOSIS — N951 Menopausal and female climacteric states: Secondary | ICD-10-CM | POA: Diagnosis not present

## 2018-07-16 DIAGNOSIS — Z90710 Acquired absence of both cervix and uterus: Secondary | ICD-10-CM | POA: Diagnosis not present

## 2018-07-16 DIAGNOSIS — Z87412 Personal history of vulvar dysplasia: Secondary | ICD-10-CM | POA: Diagnosis not present

## 2018-07-16 DIAGNOSIS — Z8639 Personal history of other endocrine, nutritional and metabolic disease: Secondary | ICD-10-CM

## 2018-07-16 NOTE — Progress Notes (Signed)
Abigail Reid [birth year] [id number]   History:    42 y.o. G3P3L3 Married.  Has 7 grand-children  RP:  Established patient presenting for annual gyn exam   HPI: H/O TAH for Fibroids in 2012.  Remote h/o VIN 3.  Colposcopy negative last year.  No pelvic pain.  No pain with IC, but mildly dry. Occasional hot flushes.  Urine/BM normal.  Breasts normal.  BMI 30.24.  Not very active physically currently.  H/O Hypothyroidism, stopped Levothyroxine a few months ago as recommended by her Fam MD.  Past medical history,surgical history, family history and social history were all reviewed and documented in the EPIC chart.  Gynecologic History Patient's last menstrual period was 03/28/2011 (lmp unknown). Contraception: status post hysterectomy Last Pap: 12/2016. Results were: Negative Last mammogram: 10/2015. Results were: Benign Bone Density: Never Colonoscopy: Never  Obstetric History OB History  Gravida Para Term Preterm AB Living  3 3 3     3   SAB TAB Ectopic Multiple Live Births          3    # Outcome Date GA Lbr Len/2nd Weight Sex Delivery Anes PTL Lv  3 Term     F Vag-Spont  N LIV  2 Term     F Vag-Spont  N LIV  1 Term     F Vag-Spont  N LIV     ROS: A ROS was performed and pertinent positives and negatives are included in the history.  GENERAL: No fevers or chills. HEENT: No change in vision, no earache, sore throat or sinus congestion. NECK: No pain or stiffness. CARDIOVASCULAR: No chest pain or pressure. No palpitations. PULMONARY: No shortness of breath, cough or wheeze. GASTROINTESTINAL: No abdominal pain, nausea, vomiting or diarrhea, melena or bright red blood per rectum. GENITOURINARY: No urinary frequency, urgency, hesitancy or dysuria. MUSCULOSKELETAL: No joint or muscle pain, no back pain, no recent trauma. DERMATOLOGIC: No rash, no itching, no lesions. ENDOCRINE: No polyuria, polydipsia, no heat or cold intolerance. No recent change in weight. HEMATOLOGICAL: No anemia or  easy bruising or bleeding. NEUROLOGIC: No headache, seizures, numbness, tingling or weakness. PSYCHIATRIC: No depression, no loss of interest in normal activity or change in sleep pattern.     Exam:   BP 128/86   Ht 5' 1.75" (1.568 m)   Wt 164 lb (74.4 kg)   LMP 03/28/2011 (LMP Unknown)   BMI 30.24 kg/m   Body mass index is 30.24 kg/m.  General appearance : Well developed well nourished female. No acute distress HEENT: Eyes: no retinal hemorrhage or exudates,  Neck supple, trachea midline, no carotid bruits, no thyroidmegaly Lungs: Clear to auscultation, no rhonchi or wheezes, or rib retractions  Heart: Regular rate and rhythm, no murmurs or gallops Breast:Examined in sitting and supine position were symmetrical in appearance, no palpable masses or tenderness,  no skin retraction, no nipple inversion, no nipple discharge, no skin discoloration, no axillary or supraclavicular lymphadenopathy Abdomen: no palpable masses or tenderness, no rebound or guarding Extremities: no edema or skin discoloration or tenderness  Pelvic: Vulva: Normal, no lesion visualized.             Vagina: No gross lesions or discharge  Cervix/Uterus absent  Adnexa  Without masses or tenderness  Anus: Normal   Assessment/Plan:  42 y.o. female for annual exam   1. Well female exam with routine gynecological exam Normal gynecologic exam status post TAH.  Pap test negative in March 2018.  No indication to repeat  today.  Breast exam normal.  Screening mammogram to schedule.  Health labs with family physician.  2. S/P TAH (total abdominal hysterectomy)  3. 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  Colposcopy of the vulva negative last year.  Normal vulvar exam today.  We will continue to pay close attention to the vulva with gynecologic annual exams.  4. Hot flushes, perimenopausal Occasional hot flashes and some vaginal dryness.  Will use lubricants or coconut/olive oil for intercourse.  Will verify [place]  level today. - FSH  5. H/O: hypothyroidism History of hypothyroidism.  Stopped levothyroxine.  No recent TSH per patient.  Will verify TSH level today. - TSH  Counseling on above issues and coordination of care more than 50% for 10 minutes.  Princess Bruins MD, 2:17 PM 07/16/2018

## 2018-07-17 ENCOUNTER — Encounter: Payer: Self-pay | Admitting: Obstetrics & Gynecology

## 2018-07-17 LAB — FOLLICLE STIMULATING HORMONE: FSH: 80.9 m[IU]/mL

## 2018-07-17 LAB — TSH: TSH: 2.62 m[IU]/L

## 2018-07-17 NOTE — Patient Instructions (Addendum)
1. Well female exam with routine gynecological exam Normal gynecologic exam status post TAH.  Pap test negative in March 2018.  No indication to repeat today.  Breast exam normal.  Screening mammogram to schedule.  Health labs with family physician.  2. S/P TAH (total abdominal hysterectomy)  3. 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  Colposcopy of the vulva negative last year.  Normal vulvar exam today.  We will continue to pay close attention to the vulva with gynecologic annual exams.  4. Hot flushes, perimenopausal Occasional hot flashes and some vaginal dryness.  Will use lubricants or coconut/olive oil for intercourse.  Will verify [place] level today. - FSH  5. H/O: hypothyroidism History of hypothyroidism.  Stopped levothyroxine.  No recent TSH per patient.  Will verify TSH level today. - TSH  Char, fue un placer encontrarle hoy! Voy a informarle de sus [employer].

## 2018-07-18 LAB — PAP IG W/ RFLX HPV ASCU

## 2019-02-01 DIAGNOSIS — R05 Cough: Secondary | ICD-10-CM | POA: Diagnosis not present

## 2019-02-01 DIAGNOSIS — R0602 Shortness of breath: Secondary | ICD-10-CM | POA: Diagnosis not present

## 2019-02-01 DIAGNOSIS — J45998 Other asthma: Secondary | ICD-10-CM | POA: Diagnosis not present

## 2019-02-03 ENCOUNTER — Encounter: Payer: Self-pay | Admitting: Emergency Medicine

## 2019-02-03 ENCOUNTER — Ambulatory Visit (INDEPENDENT_AMBULATORY_CARE_PROVIDER_SITE_OTHER): Payer: BLUE CROSS/BLUE SHIELD

## 2019-02-03 ENCOUNTER — Ambulatory Visit
Admission: EM | Admit: 2019-02-03 | Discharge: 2019-02-03 | Disposition: A | Discharge status: Home / Self Care | Payer: BLUE CROSS/BLUE SHIELD | Attending: Physician Assistant | Admitting: Physician Assistant

## 2019-02-03 ENCOUNTER — Other Ambulatory Visit: Payer: Self-pay

## 2019-02-03 DIAGNOSIS — R05 Cough: Secondary | ICD-10-CM | POA: Diagnosis not present

## 2019-02-03 DIAGNOSIS — R6889 Other general symptoms and signs: Secondary | ICD-10-CM | POA: Diagnosis not present

## 2019-02-03 DIAGNOSIS — J189 Pneumonia, unspecified organism: Secondary | ICD-10-CM | POA: Diagnosis not present

## 2019-02-03 DIAGNOSIS — Z20822 Contact with and (suspected) exposure to covid-19: Secondary | ICD-10-CM

## 2019-02-03 MED ORDER — LEVOFLOXACIN 750 MG PO TABS: 750.0000 mg | ORAL_TABLET | Freq: Every day | ORAL | 0 refills | Status: AC

## 2019-02-03 NOTE — ED Notes (Signed)
Patient able to ambulate independently  

## 2019-02-03 NOTE — ED Provider Notes (Signed)
[facility]    CSN: [id number] Arrival date & time: 02/03/19  1105     History   Chief Complaint Chief Complaint  Patient presents with  . Flu-Like Symptoms    HPI Abigail Reid is a 43 y.o. female.   43 year old female comes in for 1 week history of URI symptoms. HPI obtained by patient through video translator. Has had cough, body aches, fatigue. Denies nasal congestion, rhinorrhea, sore throat. Has had hot and cold chills. She first mentioned resolved fever, but then denied fever. Denies abdominal pain, nausea, vomiting. She feels short of breath and wheezing. She states went to an urgent care yesterday and was refilled albuterol and given tessalon for symptoms, but denies having discussed about COVID. She has been using inhaler without much relief. Her husband has body aches, but no other symptoms. Does not live with anyone else. No other sick contact.      Past Medical History:  Diagnosis Date  . Asthma    no inhaler needed x 23yrs  . Fibroid uterus   . Hypothyroidism   . STD (sexually transmitted disease) 02/21/11   POS CHLAMYDIA  . Vaginal delivery    GRAVIDA 3 PARA 3  . 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 Vitamin D deficiency 12/2016    Patient Active Problem List   Diagnosis Date Noted  . Right-sided chest pain 01/31/2017  . Asthma with acute exacerbation 01/27/2015  . Melasma 06/24/2013  . Hypothyroidism 06/13/2012  . 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 Weight gain 06/05/2012    Past Surgical History:  Procedure Laterality Date  . ABDOMINAL HYSTERECTOMY  04/25/2011   Procedure: HYSTERECTOMY ABDOMINAL;  Surgeon: Terrance Mass, MD;  Location: [facility];  Service: Gynecology;  Laterality: N/A;  Incision at 0913  . LAPAROSCOPIC TUBAL LIGATION      OB History    Gravida  3   Para  3   Term  3   Preterm      AB      Living  3     SAB      TAB      Ectopic      Multiple      Live Births  3           Home Medications    Prior to Admission medications   Medication Sig Start Date End Date Taking? Authorizing Provider  albuterol (PROVENTIL HFA;VENTOLIN HFA) 108 (90 Base) MCG/ACT inhaler Inhale 1-2 puffs into the lungs every 6 (six) hours as needed for wheezing or shortness of breath. 01/31/17   Horald Pollen, MD  levofloxacin (LEVAQUIN) 750 MG tablet Take 1 tablet (750 mg total) by mouth daily for 5 days. 02/03/19 02/08/19  Ok Edwards, PA-C  levothyroxine (SYNTHROID, LEVOTHROID) 50 MCG tablet Take 1 tablet (50 mcg total) by mouth daily before breakfast. Patient not taking: Reported on 07/16/2018 12/31/16   Terrance Mass, MD  Vitamin D, Ergocalciferol, (DRISDOL) 50000 units CAPS capsule Take 1 capsule (50,000 Units total) by mouth every 7 (seven) days. 07/03/17   Princess Bruins, MD    Family History History reviewed. No pertinent family history.  Social History Social History   Tobacco Use  . Smoking status: Never Smoker  . Smokeless tobacco: Never Used  Substance Use Topics  . Alcohol use: Yes    Alcohol/week: 0.0 standard drinks    Comment: occasional  . Drug use: No  Allergies   Patient has no known allergies.   Review of Systems Review of Systems  Reason unable to perform ROS: See HPI as above.     Physical Exam Triage Vital Signs ED Triage Vitals  Enc Vitals Group     BP 02/03/19 1113 119/82     Pulse Rate 02/03/19 1113 96     Resp 02/03/19 1113 18     Temp 02/03/19 1113 98.8 F (37.1 C)     Temp Source 02/03/19 1113 Oral     SpO2 02/03/19 1113 96 %     Weight --      Height --      Head Circumference --      Peak Flow --      Pain Score 02/03/19 1114 0     Pain Loc --      Pain Edu? --      Excl. in [place]? --    No data found.  Updated Vital Signs BP 119/82 (BP Location: Left Arm)   Pulse 96   Temp 98.8 F (37.1 C) (Oral)   Resp 18   LMP 03/28/2011 (LMP Unknown)   SpO2 96%   Physical  Exam Constitutional:      General: She is not in acute distress.    Appearance: Normal appearance. She is not ill-appearing, toxic-appearing or diaphoretic.  HENT:     Head: Normocephalic and atraumatic.     Mouth/Throat:     Mouth: Mucous membranes are moist.     Pharynx: Oropharynx is clear. Uvula midline.  Neck:     Musculoskeletal: Normal range of motion and neck supple.  Cardiovascular:     Rate and Rhythm: Normal rate and regular rhythm.     Heart sounds: Normal heart sounds. No murmur. No friction rub. No gallop.   Pulmonary:     Effort: Pulmonary effort is normal. No accessory muscle usage, prolonged expiration, respiratory distress or retractions.     Comments: Speaking in full sentences without difficulty. Lungs clear to auscultation without adventitious lung sounds. Neurological:     General: No focal deficit present.     Mental Status: She is alert and oriented to person, place, and time.    UC Treatments / Results  Labs (all labs ordered are listed, but only abnormal results are displayed) Labs Reviewed - No data to display  EKG None  Radiology Dg Chest 2 View  Result Date: 02/03/2019 CLINICAL DATA:  Cough and body aches. EXAM: CHEST - 2 VIEW COMPARISON:  January 31, 2017 FINDINGS: Bilateral pulmonary infiltrates are identified. The infiltrate in the left upper lung is mildly rounded in appearance. The heart, hila, mediastinum, lungs, and pleura are otherwise unremarkable. IMPRESSION: Bilateral pulmonary infiltrates consistent with a developing multifocal pneumonia. The opacity in the left upper lobe is mildly rounded in appearance. Recommend clinical correlation with short-term follow-up chest x-ray to ensure resolution. Electronically Signed   By: Dorise Bullion III M.D   On: 02/03/2019 11:55    Procedures Procedures (including critical care time)  Medications Ordered in UC Medications - No data to display  Initial Impression / Assessment and Plan / UC Course   I have reviewed the triage vital signs and the nursing notes.  Pertinent labs & imaging results that were available during my care of the patient were reviewed by me and considered in my medical decision making (see chart for details).    Discussed xray results with patient. History and exam most consistent with COVID. COVID testing  limited to inpatient. Patient speaking in full sentences without respiratory distress. Afebrile without antipyretic in last 8 hours. No tachycardia, tachypnea. Lungs CTAB. Given history of asthma, will provide levaquin to cover for bacterial pneumonia. Given history and exam, will have patient self quarantine. Instructions on when to end quarantine, family member isolation discussed, and resources provided. Symptomatic treatment discussed. Return precautions given. Patient expresses understanding and agrees to plan.  Final Clinical Impressions(s) / UC Diagnoses   Final diagnoses:  Suspected Covid-19 Virus Infection  Multifocal pneumonia   ED Prescriptions    Medication Sig Dispense Auth. Provider   levofloxacin (LEVAQUIN) 750 MG tablet Take 1 tablet (750 mg total) by mouth daily for 5 days. 5 tablet Tobin Chad, [place] 02/03/19 1234

## 2019-02-03 NOTE — ED Triage Notes (Signed)
Pt presents to [facility] for assessment of cough, body aches, and fatigue.  Pt has tried theraflu and nyquil without relief.  Symptoms x 1 week.

## 2019-02-03 NOTE — Discharge Instructions (Addendum)
Your current symptoms most likely caused by COVID. Your xray shows pneumonia, most likely this is due to the virus. However, I am covering you with antibiotics to make sure it is not bacteria that is causing it. Start levaquin as directed. I would like you to quarantine for 7 days since symptoms onset AND >72 hours without fever, and improved respiratory symptoms. If experiencing shortness of breath, trouble breathing, call 911 and provide them with your current situation.

## 2019-05-27 DIAGNOSIS — L811 Chloasma: Secondary | ICD-10-CM | POA: Diagnosis not present

## 2019-06-24 DIAGNOSIS — M21611 Bunion of right foot: Secondary | ICD-10-CM | POA: Diagnosis not present

## 2019-06-24 DIAGNOSIS — R609 Edema, unspecified: Secondary | ICD-10-CM | POA: Diagnosis not present

## 2019-06-24 DIAGNOSIS — M7741 Metatarsalgia, right foot: Secondary | ICD-10-CM | POA: Diagnosis not present

## 2019-06-24 DIAGNOSIS — M79672 Pain in left foot: Secondary | ICD-10-CM | POA: Diagnosis not present

## 2019-06-24 DIAGNOSIS — M79671 Pain in right foot: Secondary | ICD-10-CM | POA: Diagnosis not present

## 2019-08-19 DIAGNOSIS — M79671 Pain in right foot: Secondary | ICD-10-CM | POA: Diagnosis not present

## 2019-08-19 DIAGNOSIS — M7741 Metatarsalgia, right foot: Secondary | ICD-10-CM | POA: Diagnosis not present

## 2019-08-19 DIAGNOSIS — M21611 Bunion of right foot: Secondary | ICD-10-CM | POA: Diagnosis not present

## 2019-08-19 DIAGNOSIS — M79672 Pain in left foot: Secondary | ICD-10-CM | POA: Diagnosis not present

## 2019-08-19 DIAGNOSIS — M21612 Bunion of left foot: Secondary | ICD-10-CM | POA: Diagnosis not present

## 2019-08-19 DIAGNOSIS — R609 Edema, unspecified: Secondary | ICD-10-CM | POA: Diagnosis not present

## 2019-11-25 DIAGNOSIS — M7741 Metatarsalgia, right foot: Secondary | ICD-10-CM | POA: Diagnosis not present

## 2019-11-25 DIAGNOSIS — M79671 Pain in right foot: Secondary | ICD-10-CM | POA: Diagnosis not present

## 2019-11-25 DIAGNOSIS — M79672 Pain in left foot: Secondary | ICD-10-CM | POA: Diagnosis not present

## 2019-11-25 DIAGNOSIS — R609 Edema, unspecified: Secondary | ICD-10-CM | POA: Diagnosis not present

## 2020-02-17 DIAGNOSIS — M722 Plantar fascial fibromatosis: Secondary | ICD-10-CM | POA: Diagnosis not present

## 2020-02-17 DIAGNOSIS — M79671 Pain in right foot: Secondary | ICD-10-CM | POA: Diagnosis not present

## 2020-02-17 DIAGNOSIS — M79672 Pain in left foot: Secondary | ICD-10-CM | POA: Diagnosis not present

## 2020-02-17 DIAGNOSIS — R609 Edema, unspecified: Secondary | ICD-10-CM | POA: Diagnosis not present

## 2020-04-07 ENCOUNTER — Emergency Department (HOSPITAL_COMMUNITY)
Admission: EM | Admit: 2020-04-07 | Discharge: 2020-04-07 | Disposition: A | Discharge status: Home / Self Care | Payer: Worker's Compensation | Attending: Emergency Medicine | Admitting: Emergency Medicine

## 2020-04-07 ENCOUNTER — Emergency Department (HOSPITAL_COMMUNITY): Payer: Worker's Compensation

## 2020-04-07 DIAGNOSIS — E039 Hypothyroidism, unspecified: Secondary | ICD-10-CM | POA: Diagnosis not present

## 2020-04-07 DIAGNOSIS — S8992XA Unspecified injury of left lower leg, initial encounter: Secondary | ICD-10-CM | POA: Diagnosis present

## 2020-04-07 DIAGNOSIS — S83002A Unspecified subluxation of left patella, initial encounter: Secondary | ICD-10-CM | POA: Diagnosis not present

## 2020-04-07 DIAGNOSIS — W228XXA Striking against or struck by other objects, initial encounter: Secondary | ICD-10-CM | POA: Diagnosis not present

## 2020-04-07 DIAGNOSIS — Y929 Unspecified place or not applicable: Secondary | ICD-10-CM | POA: Insufficient documentation

## 2020-04-07 DIAGNOSIS — J45909 Unspecified asthma, uncomplicated: Secondary | ICD-10-CM | POA: Diagnosis not present

## 2020-04-07 DIAGNOSIS — Y990 Civilian activity done for income or pay: Secondary | ICD-10-CM

## 2020-04-07 DIAGNOSIS — Y999 Unspecified external cause status: Secondary | ICD-10-CM | POA: Diagnosis not present

## 2020-04-07 DIAGNOSIS — Y9389 Activity, other specified: Secondary | ICD-10-CM | POA: Insufficient documentation

## 2020-04-07 DIAGNOSIS — S83012A Lateral subluxation of left patella, initial encounter: Secondary | ICD-10-CM | POA: Insufficient documentation

## 2020-04-07 MED ORDER — HYDROMORPHONE HCL 1 MG/ML IJ SOLN
1.0000 mg | Freq: Once | INTRAMUSCULAR | Status: AC
  Administered 2020-04-07: 1 mg via INTRAMUSCULAR
  Filled 2020-04-07: qty 1

## 2020-04-07 MED ORDER — OXYCODONE-ACETAMINOPHEN 5-325 MG PO TABS
1.0000 | ORAL_TABLET | Freq: Once | ORAL | Status: AC
  Administered 2020-04-07: 1 via ORAL
  Filled 2020-04-07: qty 1

## 2020-04-07 NOTE — ED Notes (Signed)
Pt and family verbalize d/c instructions, follow up and medications as well as return demonstration of crutch usage. Pt assisted into vehicle with family, NAD

## 2020-04-07 NOTE — Progress Notes (Signed)
Orthopedic Tech Progress Note Patient Details:  MINDA FAAS [birth year] [id number]  Ortho Devices Type of Ortho Device: Knee Immobilizer, Crutches Ortho Device/Splint Location: LLE Ortho Device/Splint Interventions: Application, Ordered   Post Interventions Patient Tolerated: Well Instructions Provided: Care of device   Theodor Mustin A Takayla Baillie 04/07/2020, 6:54 PM

## 2020-04-07 NOTE — ED Triage Notes (Signed)
Pt here after getting hit in the left knee with a tire , painful to move

## 2020-04-07 NOTE — ED Provider Notes (Signed)
[facility] [facility] EMERGENCY DEPARTMENT Provider Note   CSN: [id number] Arrival date & time: 04/07/20  1110     History No chief complaint on file.   Abigail Reid is a 44 y.o. female presents to the ED for evaluation of sudden onset left knee pain.  Patient was at work.  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  She opened the door of the car and the tire popped off the car and it landed directly onto the top of her left knee and then slid down onto her knee.  Her pain was sudden, severe.  Has not been able to straighten her knee due to pain.  This happened around 9-10AM today.  Denies any previous injuries or surgeries to this joint.  Denies any other physical injuries.  Denies any distal leg tingling, loss of sensation, calf pain or swelling.  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   PERINEUM/PERIRECTAL   Vitamin D deficiency 12/2016    Patient Active Problem List   Diagnosis Date Noted   Right-sided chest pain 01/31/2017   Asthma with acute exacerbation 01/27/2015   Melasma 06/24/2013   Hypothyroidism 06/13/2012   VIN III (vulvar intraepithelial neoplasia III) 06/05/2012   Weight gain 06/05/2012    Past Surgical History:  Procedure Laterality Date   ABDOMINAL HYSTERECTOMY  04/25/2011   Procedure: HYSTERECTOMY ABDOMINAL;  Surgeon: Terrance Mass, MD;  Location: [facility];  Service: Gynecology;  Laterality: N/A;  Incision at [place]       OB History    Gravida  3   Para  3   Term  3   Preterm      AB      Living  3     SAB      TAB      Ectopic      Multiple      Live Births  3           No family history on file.  Social History   Tobacco  Use   Smoking status: Never Smoker   Smokeless tobacco: Never Used  Vaping Use   Vaping Use: Never used  Substance Use Topics   Alcohol use: Yes    Alcohol/week: 0.0 standard drinks    Comment: occasional   Drug use: No    Home Medications Prior to Admission medications   Medication Sig Start Date End Date Taking? Authorizing Provider  albuterol (PROVENTIL HFA;VENTOLIN HFA) 108 (90 Base) MCG/ACT inhaler Inhale 1-2 puffs into the lungs every 6 (six) hours as needed for wheezing or shortness of breath. 01/31/17   Horald Pollen, MD  levothyroxine (SYNTHROID, LEVOTHROID) 50 MCG tablet Take 1 tablet (50 mcg total) by mouth daily before breakfast. Patient not taking: Reported on 07/16/2018 12/31/16   Terrance Mass, MD  Vitamin D, Ergocalciferol, (DRISDOL) 50000 units CAPS capsule Take 1 capsule (50,000 Units total) by mouth every 7 (seven) days. 07/03/17   Princess Bruins, MD    Allergies    Patient has no known allergies.  Review of Systems   Review of Systems  Musculoskeletal: Positive for arthralgias, gait  problem and joint swelling.  All other systems reviewed and are negative.   Physical Exam Updated Vital Signs BP (!) 128/107 (BP Location: Left Arm)    Pulse 80    Temp 98.3 F (36.8 C) (Oral)    Resp 18    LMP 03/28/2011 (LMP Unknown)    SpO2 99%   Physical Exam Constitutional:      Appearance: She is well-developed.  HENT:     Head: Normocephalic.     Nose: Nose normal.  Eyes:     General: Lids are normal.  Cardiovascular:     Rate and Rhythm: Normal rate.     Pulses:          Dorsalis pedis pulses are 1+ on the left side.  Pulmonary:     Effort: Pulmonary effort is normal. No respiratory distress.  Musculoskeletal:     Cervical back: Normal range of motion.     Left knee: Decreased range of motion. Tenderness present. Abnormal alignment.     Comments: Left knee held in slightly flexed position.  Subtle deviation of patella laterally.  Diffuse  tenderness inferiorly and lateral to patella, patella. No popliteal space tenderness. No tibial tuberosity tenderness. Upper and lower leg compartments soft and non tender.  No significant tenderness over quad tendon or contracture.  Patient able to flex at the left hip.  Unable to straighten LLE secondary to pain/subluxation   Neurological:     Mental Status: She is alert.     Comments: Sensation to light touch intact in LLE. Ankle strength in LLE intact against resistance.  Psychiatric:        Behavior: Behavior normal.     ED Results / Procedures / Treatments   Labs (all labs ordered are listed, but only abnormal results are displayed) Labs Reviewed - No data to display  EKG None  Radiology DG Knee Complete 4 Views Left  Result Date: 04/07/2020 CLINICAL DATA:  Patellar subluxation on previous radiographs. Follow-up after physical manipulation with improved clinical appearance and pain. EXAM: LEFT KNEE - COMPLETE 4+ VIEW COMPARISON:  Left knee radiographs earlier today FINDINGS: The patella now appears normally located. No fracture, dislocation, or knee joint effusion is identified. Joint space widths are preserved. IMPRESSION: No evidence of residual patellar subluxation. Electronically Signed   By: Logan Bores M.D.   On: 04/07/2020 17:03   DG Knee Complete 4 Views Left  Result Date: 04/07/2020 CLINICAL DATA:  Knee hit by tire EXAM: LEFT KNEE - COMPLETE 4+ VIEW COMPARISON:  None. FINDINGS: Frontal, lateral, and bilateral oblique views were obtained. There is lateral patellar subluxation. No fracture, dislocation, or joint effusion. Joint spaces appear normal. No erosive change. IMPRESSION: Lateral patellar subluxation. No fracture, dislocation, or joint effusion. No appreciable arthropathy. Electronically Signed   By: Lowella Grip III M.D.   On: 04/07/2020 12:15    Procedures Procedures (including critical care time)  Medications Ordered in ED Medications    oxyCODONE-acetaminophen (PERCOCET/ROXICET) 5-325 MG per tablet 1 tablet (1 tablet Oral Given 04/07/20 1158)  HYDROmorphone (DILAUDID) injection 1 mg (1 mg Intramuscular Given 04/07/20 1430)    ED Course  I have reviewed the triage vital signs and the nursing notes.  Pertinent labs & imaging results that were available during my care of the patient were reviewed by me and considered in my medical decision making (see chart for details).  Clinical Course as of Apr 08 1707  Thu Apr 07, 2020  1338 IMPRESSION: Lateral patellar subluxation. No fracture,  dislocation, or joint effusion. No appreciable arthropathy.    DG Knee Complete 4 Views Left [CG]    Clinical Course User Index [CG] Arlean Hopping   MDM Rules/Calculators/A&P                          44 year old female presents for traumatic left knee pain.  Subtle lateral deviation of the patella on exam.  X-ray ordered by triage RN.  This was personally visualized and interpreted.  Shows lateral patellar subluxation but no associated fracture or other complicating findings.  I ordered Dilaudid IM.  Discussed findings of x-ray with patient and explained standard of care including gentle reduction of the subluxation.  She agreed to this.  Left knee slowly extended as medial patellar pressure applied.  Had full extension at the knee after reduction and improvement in pain.  She was able to activate quad tendon and lift straight leg off the bed.  Some postreduction edema noted.  No neuro pulse deficits after reduction.  Patient reevaluated by myself and EDP.  Will obtain and post reduction x-rays, placed in knee immobilizer and discharged with anti-inflammatories, ice and no weightbearing for at least 7 days.  Will give work note as well, if she no longer has pain with weightbearing after 7 days after injury she is able to return to work however if she is continuing to experience pain or instability, she was recommended to follow-up  with orthopedist for reevaluation formal medical clearance to return to work.  Final Clinical Impression(s) / ED Diagnoses Final diagnoses:  Subluxation of left patella, initial encounter  Work related injury    Rx / DC Orders ED Discharge Orders    None       Arlean Hopping 04/07/20 1708    Blanchie Dessert, MD 04/08/20 [id number]

## 2020-04-07 NOTE — ED Notes (Signed)
Ortho at bedside for knee immobilizer and crutch training

## 2020-04-07 NOTE — Discharge Instructions (Addendum)
First x-ray showed subluxation or partial dislocation of your patella.  We manipulated the patella back in place here.  Second x-rays confirmed normal anatomy and no longer subluxation  We have placed you in a knee immobilizer and crutches.  Do not flex or put weight on your leg for at least 7 days.  Alternate ibuprofen and acetaminophen every 6-8 hours for pain.  You can try to ice your knee through the knee immobilizer or if you remove it when laying down.  Ice for at least 20 minutes at a time at least morning, afternoon and night.  Elevate your leg.  After 1 week of rest and knee immobilizer, you can try and do very gentle knee flexion and extension and start to walk.  If you continue to have significant pain or instability in the knee, please contact orthopedist for formal evaluation and medical clearance before you fully return to work.  Return to the ED for worsening or sudden knee pain, loss of sensation or tingling or coolness to your extremity  For pain and inflammation you can use a combination of ibuprofen and acetaminophen.  Take 818 100 9822 mg acetaminophen (tylenol) every 6 hours or 600 mg ibuprofen (advil, motrin) every 6 hours.  You can take these separately or combine them every 6 hours for maximum pain control. Do not exceed 4,000 mg acetaminophen or 2,400 mg ibuprofen in a 24 hour period.  Do not take ibuprofen containing products if you have history of kidney disease, ulcers, GI bleeding, severe acid reflux, or take a blood thinner.  Do not take acetaminophen if you have liver disease.

## 2020-04-19 DIAGNOSIS — M25562 Pain in left knee: Secondary | ICD-10-CM | POA: Diagnosis not present

## 2020-05-09 DIAGNOSIS — M25562 Pain in left knee: Secondary | ICD-10-CM | POA: Diagnosis not present

## 2020-05-19 ENCOUNTER — Other Ambulatory Visit: Payer: Self-pay

## 2020-05-19 ENCOUNTER — Ambulatory Visit (INDEPENDENT_AMBULATORY_CARE_PROVIDER_SITE_OTHER): Payer: BC Managed Care – PPO | Admitting: Obstetrics & Gynecology

## 2020-05-19 ENCOUNTER — Encounter: Payer: Self-pay | Admitting: Obstetrics & Gynecology

## 2020-05-19 VITALS — BP 120/78 | Ht 62.0 in | Wt 184.0 lb

## 2020-05-19 DIAGNOSIS — Z6833 Body mass index (BMI) 33.0-33.9, adult: Secondary | ICD-10-CM

## 2020-05-19 DIAGNOSIS — Z780 Asymptomatic menopausal state: Secondary | ICD-10-CM

## 2020-05-19 DIAGNOSIS — Z90710 Acquired absence of both cervix and uterus: Secondary | ICD-10-CM

## 2020-05-19 DIAGNOSIS — Z01419 Encounter for gynecological examination (general) (routine) without abnormal findings: Secondary | ICD-10-CM

## 2020-05-19 DIAGNOSIS — R232 Flushing: Secondary | ICD-10-CM | POA: Diagnosis not present

## 2020-05-19 DIAGNOSIS — Z1272 Encounter for screening for malignant neoplasm of vagina: Secondary | ICD-10-CM

## 2020-05-19 DIAGNOSIS — Z87412 Personal history of vulvar dysplasia: Secondary | ICD-10-CM

## 2020-05-19 DIAGNOSIS — E6609 Other obesity due to excess calories: Secondary | ICD-10-CM

## 2020-05-19 DIAGNOSIS — E785 Hyperlipidemia, unspecified: Secondary | ICD-10-CM | POA: Diagnosis not present

## 2020-05-19 LAB — COMPREHENSIVE METABOLIC PANEL
AG Ratio: 1.4 (calc) (ref 1.0–2.5)
ALT: 15 U/L (ref 6–29)
AST: 19 U/L (ref 10–35)
Albumin: 4.2 g/dL (ref 3.6–5.1)
Alkaline phosphatase (APISO): 88 U/L (ref 31–125)
BUN: 12 mg/dL (ref 7–25)
CO2: 30 mmol/L (ref 20–32)
Calcium: 9.3 mg/dL (ref 8.6–10.2)
Chloride: 103 mmol/L (ref 98–110)
Creat: 0.77 mg/dL (ref 0.50–1.10)
Globulin: 3 g/dL (calc) (ref 1.9–3.7)
Glucose, Bld: 92 mg/dL (ref 65–99)
Potassium: 3.9 mmol/L (ref 3.5–5.3)
Sodium: 139 mmol/L (ref 135–146)
Total Bilirubin: 0.6 mg/dL (ref 0.2–1.2)
Total Protein: 7.2 g/dL (ref 6.1–8.1)

## 2020-05-19 LAB — CBC
HCT: 41.4 % (ref 35.0–45.0)
Hemoglobin: 13.4 g/dL (ref 11.7–15.5)
MCH: 30 pg (ref 27.0–33.0)
MCHC: 32.4 g/dL (ref 32.0–36.0)
MCV: 92.6 fL (ref 80.0–100.0)
MPV: 9.8 fL (ref 7.5–12.5)
Platelets: 321 10*3/uL (ref 140–400)
RBC: 4.47 10*6/uL (ref 3.80–5.10)
RDW: 12.7 % (ref 11.0–15.0)
WBC: 8.8 10*3/uL (ref 3.8–10.8)

## 2020-05-19 LAB — VITAMIN D 25 HYDROXY (VIT D DEFICIENCY, FRACTURES): Vit D, 25-Hydroxy: 18 ng/mL — ABNORMAL LOW (ref 30–100)

## 2020-05-19 LAB — LIPID PANEL
Cholesterol: 203 mg/dL — ABNORMAL HIGH (ref <200)
HDL: 67 mg/dL (ref >=50)
LDL Cholesterol (Calc): 115 mg/dL (calc) — ABNORMAL HIGH
Non-HDL Cholesterol (Calc): 136 mg/dL (calc) — ABNORMAL HIGH (ref <130)
Total CHOL/HDL Ratio: 3 (calc) (ref <5.0)
Triglycerides: 106 mg/dL (ref <150)

## 2020-05-19 LAB — TSH: TSH: 2.04 mIU/L

## 2020-05-19 NOTE — Progress Notes (Signed)
Abigail Reid Mar 30, 1976 [id number]   History:    44 y.o. G3P3L3 Married.  Has 5 grand-children  RP:  Established patient presenting for annual gyn exam   HPI: H/O TAH for Fibroids in 2012.  Remote h/o VIN 3.  Colposcopy negative in 2018.  No pelvic pain.  No pain with IC, but mildly dry. Occasional hot flushes.  Urine/BM normal.  Breasts normal. BMI 33.65.  Not very active physically currently.  H/O Hypothyroidism, stopped Levothyroxine a few months ago as recommended by her Fam MD.  Past medical history,surgical history, family history and social history were all reviewed and documented in the EPIC chart.  Gynecologic History Patient's last menstrual period was 03/28/2011 (lmp unknown).  Obstetric History OB History  Gravida Para Term Preterm AB Living  '3 3 3     3  '$ SAB TAB Ectopic Multiple Live Births          3    # Outcome Date GA Lbr Len/2nd Weight Sex Delivery Anes PTL Lv  3 Term     F Vag-Spont  N LIV  2 Term     F Vag-Spont  N LIV  1 Term     F Vag-Spont  N LIV     ROS: A ROS was performed and pertinent positives and negatives are included in the history.  GENERAL: No fevers or chills. HEENT: No change in vision, no earache, sore throat or sinus congestion. NECK: No pain or stiffness. CARDIOVASCULAR: No chest pain or pressure. No palpitations. PULMONARY: No shortness of breath, cough or wheeze. GASTROINTESTINAL: No abdominal pain, nausea, vomiting or diarrhea, melena or bright red blood per rectum. GENITOURINARY: No urinary frequency, urgency, hesitancy or dysuria. MUSCULOSKELETAL: No joint or muscle pain, no back pain, no recent trauma. DERMATOLOGIC: No rash, no itching, no lesions. ENDOCRINE: No polyuria, polydipsia, no heat or cold intolerance. No recent change in weight. HEMATOLOGICAL: No anemia or easy bruising or bleeding. NEUROLOGIC: No headache, seizures, numbness, tingling or weakness. PSYCHIATRIC: No depression, no loss of interest in normal activity or change  in sleep pattern.     Exam:   BP 120/78   Ht '5\' 2"'$  (1.575 m)   Wt 184 lb (83.5 kg)   LMP 03/28/2011 (LMP Unknown)   BMI 33.65 kg/m   Body mass index is 33.65 kg/m.  General appearance : Well developed well nourished female. No acute distress HEENT: Eyes: no retinal hemorrhage or exudates,  Neck supple, trachea midline, no carotid bruits, no thyroidmegaly Lungs: Clear to auscultation, no rhonchi or wheezes, or rib retractions  Heart: Regular rate and rhythm, no murmurs or gallops Breast:Examined in sitting and supine position were symmetrical in appearance, no palpable masses or tenderness,  no skin retraction, no nipple inversion, no nipple discharge, no skin discoloration, no axillary or supraclavicular lymphadenopathy Abdomen: no palpable masses or tenderness, no rebound or guarding Extremities: no edema or skin discoloration or tenderness  Pelvic: Vulva: Normal             Vagina: No gross lesions or discharge  Cervix/Uterus absent  Adnexa  Without masses or tenderness  Anus: Normal   Assessment/Plan:  44 y.o. female for annual exam   1. Encounter for Papanicolaou smear of vagina as part of routine gynecological examination Gynecologic exam status post total hysterectomy.  Pap test on vaginal vault today.  Breast exam normal.  Last mammogram 2017, will schedule a screening mammogram now.  Fasting health labs here today. - CBC - Comp Met (  CMET) - Lipid panel - TSH - VITAMIN D 25 Hydroxy (Vit-D Deficiency, Fractures)  2. 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  3. S/P TAH (total abdominal hysterectomy)  4. Postmenopause Well on no hormone replacement therapy.  Vitamin D supplements, calcium intake of 1200 mg daily and regular weightbearing physical activity is recommended.  5. Class 1 obesity due to excess calories without serious comorbidity with body mass index (BMI) of 33.0 to 33.9 in adult Recommend a lower calorie/carb diet such as [employer].  Aerobic  activities 5 times a week and light weightlifting every 2 days.  Princess Bruins MD, 10:36 AM 05/19/2020

## 2020-05-20 ENCOUNTER — Encounter: Payer: Self-pay | Admitting: Obstetrics & Gynecology

## 2020-05-20 LAB — PAP IG W/ RFLX HPV ASCU

## 2020-05-23 ENCOUNTER — Other Ambulatory Visit: Payer: Self-pay | Admitting: Anesthesiology

## 2020-05-23 MED ORDER — VITAMIN D (ERGOCALCIFEROL) 1.25 MG (50000 UNIT) PO CAPS
50000.0000 [IU] | ORAL_CAPSULE | ORAL | 0 refills | Status: AC
Start: 2020-05-23 — End: ?

## 2020-06-07 DIAGNOSIS — Z1231 Encounter for screening mammogram for malignant neoplasm of breast: Secondary | ICD-10-CM | POA: Diagnosis not present

## 2020-07-25 ENCOUNTER — Other Ambulatory Visit: Payer: Self-pay | Admitting: Obstetrics and Gynecology

## 2020-07-25 DIAGNOSIS — R928 Other abnormal and inconclusive findings on diagnostic imaging of breast: Secondary | ICD-10-CM

## 2020-08-13 ENCOUNTER — Other Ambulatory Visit: Payer: Self-pay | Admitting: Obstetrics & Gynecology

## 2020-08-29 ENCOUNTER — Other Ambulatory Visit: Payer: BC Managed Care – PPO

## 2020-09-07 DIAGNOSIS — R928 Other abnormal and inconclusive findings on diagnostic imaging of breast: Secondary | ICD-10-CM | POA: Diagnosis not present

## 2021-01-04 DIAGNOSIS — S86912A Strain of unspecified muscle(s) and tendon(s) at lower leg level, left leg, initial encounter: Secondary | ICD-10-CM | POA: Diagnosis not present

## 2021-01-04 DIAGNOSIS — S83412A Sprain of medial collateral ligament of left knee, initial encounter: Secondary | ICD-10-CM | POA: Diagnosis not present

## 2021-01-04 DIAGNOSIS — M7652 Patellar tendinitis, left knee: Secondary | ICD-10-CM | POA: Diagnosis not present

## 2021-01-04 DIAGNOSIS — M9906 Segmental and somatic dysfunction of lower extremity: Secondary | ICD-10-CM | POA: Diagnosis not present

## 2021-01-06 DIAGNOSIS — M9906 Segmental and somatic dysfunction of lower extremity: Secondary | ICD-10-CM | POA: Diagnosis not present

## 2021-01-06 DIAGNOSIS — S83412A Sprain of medial collateral ligament of left knee, initial encounter: Secondary | ICD-10-CM | POA: Diagnosis not present

## 2021-01-06 DIAGNOSIS — S86912A Strain of unspecified muscle(s) and tendon(s) at lower leg level, left leg, initial encounter: Secondary | ICD-10-CM | POA: Diagnosis not present

## 2021-01-06 DIAGNOSIS — M7652 Patellar tendinitis, left knee: Secondary | ICD-10-CM | POA: Diagnosis not present

## 2021-01-13 DIAGNOSIS — S86912A Strain of unspecified muscle(s) and tendon(s) at lower leg level, left leg, initial encounter: Secondary | ICD-10-CM | POA: Diagnosis not present

## 2021-01-13 DIAGNOSIS — S83412A Sprain of medial collateral ligament of left knee, initial encounter: Secondary | ICD-10-CM | POA: Diagnosis not present

## 2021-01-13 DIAGNOSIS — M7652 Patellar tendinitis, left knee: Secondary | ICD-10-CM | POA: Diagnosis not present

## 2021-01-13 DIAGNOSIS — M9906 Segmental and somatic dysfunction of lower extremity: Secondary | ICD-10-CM | POA: Diagnosis not present
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
## 2021-07-15 DIAGNOSIS — S99911A Unspecified injury of right ankle, initial encounter: Secondary | ICD-10-CM | POA: Diagnosis not present

## 2021-07-15 DIAGNOSIS — S92354A Nondisplaced fracture of fifth metatarsal bone, right foot, initial encounter for closed fracture: Secondary | ICD-10-CM | POA: Diagnosis not present

## 2021-07-17 DIAGNOSIS — S92351A Displaced fracture of fifth metatarsal bone, right foot, initial encounter for closed fracture: Secondary | ICD-10-CM | POA: Diagnosis not present

## 2021-07-17 DIAGNOSIS — Y999 Unspecified external cause status: Secondary | ICD-10-CM | POA: Diagnosis not present

## 2021-07-17 DIAGNOSIS — W1839XA Other fall on same level, initial encounter: Secondary | ICD-10-CM | POA: Diagnosis not present

## 2021-07-18 DIAGNOSIS — S92351A Displaced fracture of fifth metatarsal bone, right foot, initial encounter for closed fracture: Secondary | ICD-10-CM | POA: Diagnosis not present

## 2021-08-29 DIAGNOSIS — S92351A Displaced fracture of fifth metatarsal bone, right foot, initial encounter for closed fracture: Secondary | ICD-10-CM | POA: Diagnosis not present

## 2021-08-29 DIAGNOSIS — S92351D Displaced fracture of fifth metatarsal bone, right foot, subsequent encounter for fracture with routine healing: Secondary | ICD-10-CM | POA: Diagnosis not present

## 2021-09-06 ENCOUNTER — Ambulatory Visit: Payer: BC Managed Care – PPO | Admitting: Obstetrics & Gynecology

## 2021-11-06 ENCOUNTER — Encounter: Payer: Self-pay | Admitting: Obstetrics & Gynecology

## 2021-11-06 ENCOUNTER — Ambulatory Visit (INDEPENDENT_AMBULATORY_CARE_PROVIDER_SITE_OTHER): Payer: BC Managed Care – PPO | Admitting: Obstetrics & Gynecology

## 2021-11-06 ENCOUNTER — Other Ambulatory Visit: Payer: Self-pay

## 2021-11-06 VITALS — BP 116/74 | Ht 62.0 in | Wt 186.0 lb

## 2021-11-06 DIAGNOSIS — Z90710 Acquired absence of both cervix and uterus: Secondary | ICD-10-CM

## 2021-11-06 DIAGNOSIS — Z780 Asymptomatic menopausal state: Secondary | ICD-10-CM

## 2021-11-06 DIAGNOSIS — Z6834 Body mass index (BMI) 34.0-34.9, adult: Secondary | ICD-10-CM

## 2021-11-06 DIAGNOSIS — N952 Postmenopausal atrophic vaginitis: Secondary | ICD-10-CM | POA: Diagnosis not present

## 2021-11-06 DIAGNOSIS — Z01419 Encounter for gynecological examination (general) (routine) without abnormal findings: Secondary | ICD-10-CM | POA: Diagnosis not present

## 2021-11-06 DIAGNOSIS — E6609 Other obesity due to excess calories: Secondary | ICD-10-CM

## 2021-11-06 MED ORDER — ESTRADIOL 0.1 MG/GM VA CREA: 1.0000 | TOPICAL_CREAM | VAGINAL | 4 refills | Status: AC

## 2021-11-06 NOTE — Progress Notes (Signed)
Abigail Reid Feb 16, 1976 [id number]   History:    46 y.o. G3P3L3 Married.  Has 5 grand-children   RP:  Established patient presenting for annual gyn exam    HPI: H/O TAH for Fibroids in 2012.  Remote h/o VIN 3.  Colposcopy negative in 2018.  Last Pap Neg 05-19-20.  No pelvic pain.  C/O pain with IC, but mildly dry. Occasional hot flushes.  Urine/BM normal.  Breasts normal. Mammo Neg 10-25-15, might have done one at work last year, will schedule at the [facility] now.  BMI 34.02.  Not very active physically currently.  Fasting Health labs here today.   Past medical history,surgical history, family history and social history were all reviewed and documented in the EPIC chart.  Gynecologic History Patient's last menstrual period was 03/28/2011 (lmp unknown).  Obstetric History OB History  Gravida Para Term Preterm AB Living  _0 SAB IAB Ectopic Multiple Live Births          3    # Outcome Date GA Lbr Len/2nd Weight Sex Delivery Anes PTL Lv  3 Term     F Vag-Spont  N LIV  2 Term     F Vag-Spont  N LIV  1 Term     F Vag-Spont  N LIV     ROS: A ROS was performed and pertinent positives and negatives are included in the history. GENERAL: No fevers or chills. HEENT: No change in vision, no earache, sore throat or sinus congestion. NECK: No pain or stiffness. CARDIOVASCULAR: No chest pain or pressure. No palpitations. PULMONARY: No shortness of breath, cough or wheeze. GASTROINTESTINAL: No abdominal pain, nausea, vomiting or diarrhea, melena or bright red blood per rectum. GENITOURINARY: No urinary frequency, urgency, hesitancy or dysuria. MUSCULOSKELETAL: No joint or muscle pain, no back pain, no recent trauma. DERMATOLOGIC: No rash, no itching, no lesions. ENDOCRINE: No polyuria, polydipsia, no heat or cold intolerance. No recent change in weight. HEMATOLOGICAL: No anemia or easy bruising or bleeding. NEUROLOGIC: No headache, seizures, numbness, tingling or weakness.  PSYCHIATRIC: No depression, no loss of interest in normal activity or change in sleep pattern.     Exam:   BP 116/74    Ht _1  (1.575 m)    Wt 186 lb (84.4 kg)    LMP 03/28/2011 (LMP Unknown)    BMI 34.02 kg/m   Body mass index is 34.02 kg/m.  General appearance : Well developed well nourished female. No acute distress HEENT: Eyes: no retinal hemorrhage or exudates,  Neck supple, trachea midline, no carotid bruits, no thyroidmegaly Lungs: Clear to auscultation, no rhonchi or wheezes, or rib retractions  Heart: Regular rate and rhythm, no murmurs or gallops Breast:Examined in sitting and supine position were symmetrical in appearance, no palpable masses or tenderness,  no skin retraction, no nipple inversion, no nipple discharge, no skin discoloration, no axillary or supraclavicular lymphadenopathy Abdomen: no palpable masses or tenderness, no rebound or guarding Extremities: no edema or skin discoloration or tenderness  Pelvic: Vulva: Normal             Vagina: No gross lesions or discharge  Cervix/Uterus absent  Adnexa  Without masses or tenderness  Anus: Normal   Assessment/Plan:  46 y.o. female for annual exam   1. Well female exam with routine gynecological exam H/O TAH for Fibroids in 2012.  Remote h/o VIN 3.  Colposcopy negative in 2018.  Last Pap Neg 05-19-20.  No pelvic pain.  C/O pain with IC, but mildly dry. Occasional hot flushes.  Urine/BM normal.  Breasts normal. Mammo Neg 10-25-15, might have done one at work last year, will schedule at the [facility] now.  BMI 34.02.  Not very active physically currently.  Fasting Health labs here today. - CBC - Comp Met (CMET) - TSH - Lipid Profile - Vitamin D (25 hydroxy)  2. S/P TAH (total abdominal hysterectomy)  3. Postmenopause Intermittent hot flushes and dryness with IC.  Will check [place] today.  Vit D supplements, Ca++ 1.5 g/d total, regular weight bearing physical activities. - [place]  4. Postmenopausal atrophic  vaginitis Pain and dryness with IC.  Recommend Coconut oil.  Will also start on Estradiol cream twice a week.  Risks/Benefits and usage reviewed.  Prescription sent to pharmacy.  5. Class 1 obesity due to excess calories without serious comorbidity with body mass index (BMI) of 34.0 to 34.9 in adult Recommend a lower calorie/carb diet.  Increase fitness activities.  Other orders - estradiol (ESTRACE VAGINAL) 0.1 MG/GM vaginal cream; Place 1 Applicatorful vaginally 2 (two) times a week.   Princess Bruins MD, 8:14 AM 11/06/2021

## 2021-11-07 LAB — CBC
HCT: 41.6 % (ref 35.0–45.0)
Hemoglobin: 13.9 g/dL (ref 11.7–15.5)
MCH: 30.9 pg (ref 27.0–33.0)
MCHC: 33.4 g/dL (ref 32.0–36.0)
MCV: 92.4 fL (ref 80.0–100.0)
MPV: 10 fL (ref 7.5–12.5)
Platelets: 320 10*3/uL (ref 140–400)
RBC: 4.5 10*6/uL (ref 3.80–5.10)
RDW: 12.4 % (ref 11.0–15.0)
WBC: 6.9 10*3/uL (ref 3.8–10.8)

## 2021-11-07 LAB — COMPREHENSIVE METABOLIC PANEL
AG Ratio: 1.3 (calc) (ref 1.0–2.5)
ALT: 10 U/L (ref 6–29)
AST: 16 U/L (ref 10–35)
Albumin: 4.3 g/dL (ref 3.6–5.1)
Alkaline phosphatase (APISO): 83 U/L (ref 31–125)
BUN: 11 mg/dL (ref 7–25)
CO2: 34 mmol/L — ABNORMAL HIGH (ref 20–32)
Calcium: 9.8 mg/dL (ref 8.6–10.2)
Chloride: 105 mmol/L (ref 98–110)
Creat: 0.78 mg/dL (ref 0.50–0.99)
Globulin: 3.3 g/dL (calc) (ref 1.9–3.7)
Glucose, Bld: 97 mg/dL (ref 65–99)
Potassium: 4.5 mmol/L (ref 3.5–5.3)
Sodium: 139 mmol/L (ref 135–146)
Total Bilirubin: 0.3 mg/dL (ref 0.2–1.2)
Total Protein: 7.6 g/dL (ref 6.1–8.1)

## 2021-11-07 LAB — LIPID PANEL
Cholesterol: 212 mg/dL — ABNORMAL HIGH (ref <200)
HDL: 75 mg/dL (ref >=50)
LDL Cholesterol (Calc): 120 mg/dL (calc) — ABNORMAL HIGH
Non-HDL Cholesterol (Calc): 137 mg/dL (calc) — ABNORMAL HIGH (ref <130)
Total CHOL/HDL Ratio: 2.8 (calc) (ref <5.0)
Triglycerides: 75 mg/dL (ref <150)

## 2021-11-07 LAB — TSH: TSH: 3.7 mIU/L

## 2021-11-07 LAB — FOLLICLE STIMULATING HORMONE: FSH: 97.5 m[IU]/mL

## 2021-11-07 LAB — VITAMIN D 25 HYDROXY (VIT D DEFICIENCY, FRACTURES): Vit D, 25-Hydroxy: 28 ng/mL — ABNORMAL LOW (ref 30–100)

## 2022-05-31 DIAGNOSIS — I8312 Varicose veins of left lower extremity with inflammation: Secondary | ICD-10-CM | POA: Diagnosis not present

## 2022-05-31 DIAGNOSIS — I8311 Varicose veins of right lower extremity with inflammation: Secondary | ICD-10-CM | POA: Diagnosis not present

## 2022-06-07 DIAGNOSIS — I8312 Varicose veins of left lower extremity with inflammation: Secondary | ICD-10-CM | POA: Diagnosis not present

## 2022-06-07 DIAGNOSIS — I8311 Varicose veins of right lower extremity with inflammation: Secondary | ICD-10-CM | POA: Diagnosis not present

## 2022-06-18 DIAGNOSIS — M25561 Pain in right knee: Secondary | ICD-10-CM | POA: Diagnosis not present

## 2022-07-07 DIAGNOSIS — M25561 Pain in right knee: Secondary | ICD-10-CM | POA: Diagnosis not present

## 2022-07-10 DIAGNOSIS — Z23 Encounter for immunization: Secondary | ICD-10-CM | POA: Diagnosis not present

## 2022-07-11 DIAGNOSIS — M23203 Derangement of unspecified medial meniscus due to old tear or injury, right knee: Secondary | ICD-10-CM | POA: Diagnosis not present

## 2022-07-11 DIAGNOSIS — M94261 Chondromalacia, right knee: Secondary | ICD-10-CM | POA: Diagnosis not present

## 2022-08-08 DIAGNOSIS — M94261 Chondromalacia, right knee: Secondary | ICD-10-CM | POA: Diagnosis not present

## 2022-10-09 DIAGNOSIS — M79662 Pain in left lower leg: Secondary | ICD-10-CM | POA: Diagnosis not present

## 2022-10-09 DIAGNOSIS — I83893 Varicose veins of bilateral lower extremities with other complications: Secondary | ICD-10-CM | POA: Diagnosis not present

## 2022-10-09 DIAGNOSIS — M79604 Pain in right leg: Secondary | ICD-10-CM | POA: Diagnosis not present

## 2022-10-09 DIAGNOSIS — M79661 Pain in right lower leg: Secondary | ICD-10-CM | POA: Diagnosis not present

## 2022-11-07 ENCOUNTER — Ambulatory Visit: Payer: BC Managed Care – PPO | Admitting: Obstetrics & Gynecology

## 2022-11-14 DIAGNOSIS — I83891 Varicose veins of right lower extremities with other complications: Secondary | ICD-10-CM | POA: Diagnosis not present

## 2022-11-20 ENCOUNTER — Ambulatory Visit: Payer: BC Managed Care – PPO | Admitting: Obstetrics & Gynecology

## 2022-11-21 DIAGNOSIS — Z09 Encounter for follow-up examination after completed treatment for conditions other than malignant neoplasm: Secondary | ICD-10-CM | POA: Diagnosis not present

## 2022-11-21 DIAGNOSIS — I83891 Varicose veins of right lower extremities with other complications: Secondary | ICD-10-CM | POA: Diagnosis not present

## 2022-12-26 DIAGNOSIS — I83892 Varicose veins of left lower extremities with other complications: Secondary | ICD-10-CM | POA: Diagnosis not present

## 2023-01-02 DIAGNOSIS — Z09 Encounter for follow-up examination after completed treatment for conditions other than malignant neoplasm: Secondary | ICD-10-CM | POA: Diagnosis not present

## 2023-01-02 DIAGNOSIS — I87392 Chronic venous hypertension (idiopathic) with other complications of left lower extremity: Secondary | ICD-10-CM | POA: Diagnosis not present

## 2023-01-16 DIAGNOSIS — I83892 Varicose veins of left lower extremities with other complications: Secondary | ICD-10-CM | POA: Diagnosis not present

## 2023-01-23 DIAGNOSIS — I83891 Varicose veins of right lower extremities with other complications: Secondary | ICD-10-CM | POA: Diagnosis not present

## 2023-01-23 DIAGNOSIS — Z09 Encounter for follow-up examination after completed treatment for conditions other than malignant neoplasm: Secondary | ICD-10-CM | POA: Diagnosis not present

## 2023-01-29 DIAGNOSIS — Z09 Encounter for follow-up examination after completed treatment for conditions other than malignant neoplasm: Secondary | ICD-10-CM | POA: Diagnosis not present

## 2023-01-31 DIAGNOSIS — I83892 Varicose veins of left lower extremities with other complications: Secondary | ICD-10-CM | POA: Diagnosis not present

## 2023-02-27 DIAGNOSIS — M79661 Pain in right lower leg: Secondary | ICD-10-CM | POA: Diagnosis not present

## 2023-02-27 DIAGNOSIS — I83891 Varicose veins of right lower extremities with other complications: Secondary | ICD-10-CM | POA: Diagnosis not present

## 2023-03-20 DIAGNOSIS — I83892 Varicose veins of left lower extremities with other complications: Secondary | ICD-10-CM | POA: Diagnosis not present

## 2023-03-27 DIAGNOSIS — I83891 Varicose veins of right lower extremities with other complications: Secondary | ICD-10-CM | POA: Diagnosis not present

## 2023-03-27 DIAGNOSIS — I87391 Chronic venous hypertension (idiopathic) with other complications of right lower extremity: Secondary | ICD-10-CM | POA: Diagnosis not present

## 2023-04-03 DIAGNOSIS — I83892 Varicose veins of left lower extremities with other complications: Secondary | ICD-10-CM | POA: Diagnosis not present

## 2023-04-17 DIAGNOSIS — I87391 Chronic venous hypertension (idiopathic) with other complications of right lower extremity: Secondary | ICD-10-CM | POA: Diagnosis not present

## 2023-04-17 DIAGNOSIS — I83891 Varicose veins of right lower extremities with other complications: Secondary | ICD-10-CM | POA: Diagnosis not present

## 2023-06-15 DIAGNOSIS — M25562 Pain in left knee: Secondary | ICD-10-CM | POA: Diagnosis not present

## 2023-06-15 DIAGNOSIS — Z23 Encounter for immunization: Secondary | ICD-10-CM | POA: Diagnosis not present

## 2023-06-15 DIAGNOSIS — S2231XA Fracture of one rib, right side, initial encounter for closed fracture: Secondary | ICD-10-CM | POA: Diagnosis not present

## 2023-06-15 DIAGNOSIS — M545 Low back pain, unspecified: Secondary | ICD-10-CM | POA: Diagnosis not present

## 2023-06-15 DIAGNOSIS — M25522 Pain in left elbow: Secondary | ICD-10-CM | POA: Diagnosis not present

## 2023-06-15 DIAGNOSIS — M7989 Other specified soft tissue disorders: Secondary | ICD-10-CM | POA: Diagnosis not present

## 2023-06-15 DIAGNOSIS — M79652 Pain in left thigh: Secondary | ICD-10-CM | POA: Diagnosis not present

## 2023-06-15 DIAGNOSIS — R109 Unspecified abdominal pain: Secondary | ICD-10-CM | POA: Diagnosis not present

## 2023-06-15 DIAGNOSIS — M25532 Pain in left wrist: Secondary | ICD-10-CM | POA: Diagnosis not present

## 2023-06-15 DIAGNOSIS — Y92410 Unspecified street and highway as the place of occurrence of the external cause: Secondary | ICD-10-CM | POA: Diagnosis not present

## 2023-06-15 DIAGNOSIS — M25572 Pain in left ankle and joints of left foot: Secondary | ICD-10-CM | POA: Diagnosis not present

## 2023-06-15 DIAGNOSIS — M542 Cervicalgia: Secondary | ICD-10-CM | POA: Diagnosis not present

## 2023-06-15 DIAGNOSIS — M79632 Pain in left forearm: Secondary | ICD-10-CM | POA: Diagnosis not present

## 2023-06-15 DIAGNOSIS — M79622 Pain in left upper arm: Secondary | ICD-10-CM | POA: Diagnosis not present

## 2023-06-15 DIAGNOSIS — M79662 Pain in left lower leg: Secondary | ICD-10-CM | POA: Diagnosis not present

## 2023-06-15 DIAGNOSIS — R079 Chest pain, unspecified: Secondary | ICD-10-CM | POA: Diagnosis not present

## 2023-06-15 DIAGNOSIS — S0990XA Unspecified injury of head, initial encounter: Secondary | ICD-10-CM | POA: Diagnosis not present

## 2023-06-15 DIAGNOSIS — K429 Umbilical hernia without obstruction or gangrene: Secondary | ICD-10-CM | POA: Diagnosis not present

## 2023-06-15 DIAGNOSIS — M79672 Pain in left foot: Secondary | ICD-10-CM | POA: Diagnosis not present

## 2023-06-15 DIAGNOSIS — M79642 Pain in left hand: Secondary | ICD-10-CM | POA: Diagnosis not present

## 2023-07-11 DIAGNOSIS — R0781 Pleurodynia: Secondary | ICD-10-CM | POA: Diagnosis not present

## 2023-08-07 DIAGNOSIS — I872 Venous insufficiency (chronic) (peripheral): Secondary | ICD-10-CM | POA: Diagnosis not present

## 2023-08-07 DIAGNOSIS — R600 Localized edema: Secondary | ICD-10-CM | POA: Diagnosis not present
